# Patient Record
Sex: Male | Born: 2000 | Race: Black or African American | Hispanic: No | Marital: Single | Smoking: Never smoker
Health system: Southern US, Community
[De-identification: ages and names within clinical notes are randomized; demographics above are authoritative.]

---

## 2021-05-15 ENCOUNTER — Emergency Department (HOSPITAL_COMMUNITY): Payer: Medicaid Other

## 2021-05-15 ENCOUNTER — Other Ambulatory Visit: Payer: Self-pay

## 2021-05-15 ENCOUNTER — Encounter (HOSPITAL_COMMUNITY): Payer: Self-pay | Admitting: Emergency Medicine

## 2021-05-15 ENCOUNTER — Observation Stay (HOSPITAL_COMMUNITY)
Admission: EM | Admit: 2021-05-15 | Discharge: 2021-05-16 | Disposition: A | Discharge status: Home / Self Care | Payer: Medicaid Other | Attending: Emergency Medicine | Admitting: Emergency Medicine

## 2021-05-15 DIAGNOSIS — Y900 Blood alcohol level of less than 20 mg/100 ml: Secondary | ICD-10-CM | POA: Insufficient documentation

## 2021-05-15 DIAGNOSIS — Z20822 Contact with and (suspected) exposure to covid-19: Secondary | ICD-10-CM | POA: Insufficient documentation

## 2021-05-15 DIAGNOSIS — R45851 Suicidal ideations: Secondary | ICD-10-CM | POA: Insufficient documentation

## 2021-05-15 DIAGNOSIS — R110 Nausea: Secondary | ICD-10-CM | POA: Diagnosis present

## 2021-05-15 DIAGNOSIS — Z79899 Other long term (current) drug therapy: Secondary | ICD-10-CM | POA: Insufficient documentation

## 2021-05-15 DIAGNOSIS — D72829 Elevated white blood cell count, unspecified: Secondary | ICD-10-CM | POA: Diagnosis not present

## 2021-05-15 DIAGNOSIS — N179 Acute kidney failure, unspecified: Secondary | ICD-10-CM | POA: Diagnosis not present

## 2021-05-15 LAB — COMPREHENSIVE METABOLIC PANEL
ALT: 24 U/L (ref 0–44)
AST: 40 U/L (ref 15–41)
Albumin: 5.2 g/dL — ABNORMAL HIGH (ref 3.5–5.0)
Alkaline Phosphatase: 60 U/L (ref 38–126)
Anion gap: 15 (ref 5–15)
BUN: 12 mg/dL (ref 6–20)
CO2: 22 mmol/L (ref 22–32)
Calcium: 10 mg/dL (ref 8.9–10.3)
Chloride: 103 mmol/L (ref 98–111)
Creatinine, Ser: 1.93 mg/dL — ABNORMAL HIGH (ref 0.61–1.24)
GFR, Estimated: 51 mL/min — ABNORMAL LOW (ref >60)
Glucose, Bld: 130 mg/dL — ABNORMAL HIGH (ref 70–99)
Potassium: 3.7 mmol/L (ref 3.5–5.1)
Sodium: 140 mmol/L (ref 135–145)
Total Bilirubin: 1.4 mg/dL — ABNORMAL HIGH (ref 0.3–1.2)
Total Protein: 8.2 g/dL — ABNORMAL HIGH (ref 6.5–8.1)

## 2021-05-15 LAB — CBC WITH DIFFERENTIAL/PLATELET
Abs Immature Granulocytes: 0.12 10*3/uL — ABNORMAL HIGH (ref 0.00–0.07)
Basophils Absolute: 0 10*3/uL (ref 0.0–0.1)
Basophils Relative: 0 %
Eosinophils Absolute: 0 10*3/uL (ref 0.0–0.5)
Eosinophils Relative: 0 %
HCT: 47 % (ref 39.0–52.0)
Hemoglobin: 16.2 g/dL (ref 13.0–17.0)
Immature Granulocytes: 1 %
Lymphocytes Relative: 7 %
Lymphs Abs: 1.3 10*3/uL (ref 0.7–4.0)
MCH: 30.6 pg (ref 26.0–34.0)
MCHC: 34.5 g/dL (ref 30.0–36.0)
MCV: 88.7 fL (ref 80.0–100.0)
Monocytes Absolute: 1.7 10*3/uL — ABNORMAL HIGH (ref 0.1–1.0)
Monocytes Relative: 9 %
Neutro Abs: 15.4 10*3/uL — ABNORMAL HIGH (ref 1.7–7.7)
Neutrophils Relative %: 83 %
Platelets: 350 10*3/uL (ref 150–400)
RBC: 5.3 MIL/uL (ref 4.22–5.81)
RDW: 12.3 % (ref 11.5–15.5)
WBC: 18.5 10*3/uL — ABNORMAL HIGH (ref 4.0–10.5)
nRBC: 0 % (ref 0.0–0.2)

## 2021-05-15 LAB — RESP PANEL BY RT-PCR (FLU A&B, COVID) ARPGX2
Influenza A by PCR: NEGATIVE
Influenza B by PCR: NEGATIVE
SARS Coronavirus 2 by RT PCR: NEGATIVE

## 2021-05-15 LAB — T4, FREE: Free T4: 1.22 ng/dL — ABNORMAL HIGH (ref 0.61–1.12)

## 2021-05-15 LAB — ACETAMINOPHEN LEVEL: Acetaminophen (Tylenol), Serum: <10 ug/mL — ABNORMAL LOW (ref 10–30)

## 2021-05-15 LAB — ETHANOL: Alcohol, Ethyl (B): <10 mg/dL (ref <10)

## 2021-05-15 LAB — TSH: TSH: 1.05 u[IU]/mL (ref 0.350–4.500)

## 2021-05-15 LAB — SALICYLATE LEVEL: Salicylate Lvl: <7 mg/dL — ABNORMAL LOW (ref 7.0–30.0)

## 2021-05-15 MED ORDER — ACETAMINOPHEN 325 MG PO TABS
650.0000 mg | ORAL_TABLET | Freq: Once | ORAL | Status: AC
  Administered 2021-05-15: 650 mg via ORAL
  Filled 2021-05-15: qty 2

## 2021-05-15 MED ORDER — LORAZEPAM 1 MG PO TABS
1.0000 mg | ORAL_TABLET | Freq: Once | ORAL | Status: AC
  Administered 2021-05-15: 1 mg via ORAL
  Filled 2021-05-15: qty 1

## 2021-05-15 MED ORDER — SODIUM CHLORIDE 0.9 % IV BOLUS
1000.0000 mL | Freq: Once | INTRAVENOUS | Status: AC
  Administered 2021-05-15: 1000 mL via INTRAVENOUS

## 2021-05-15 NOTE — ED Provider Notes (Signed)
Emergency Medicine Provider Triage Evaluation Note  Alexander Gilmore , a 20 y.o. male  was evaluated in triage.  Pt complains of SI with plan to cut himself. He states the cops brought him here because they thought he had drugs in his system.  Review of Systems  Positive: Si Negative: Abd pain, nvd, urinary sxs  Physical Exam  BP (!) 159/114 (BP Location: Right Arm)   Pulse (!) 112   Temp 100.3 F (37.9 C)   Resp (!) 21   SpO2 95%  Gen:   Awake, no distress   Resp:  Normal effort  MSK:   Moves extremities without difficulty  Other:  Abd soft  Medical Decision Making  Medically screening exam initiated at 4:45 PM.  Appropriate orders placed.  ZEALAND BOYETT was informed that the remainder of the evaluation will be completed by another provider, this initial triage assessment does not replace that evaluation, and the importance of remaining in the ED until their evaluation is complete.     Karrie Meres, PA-C 05/15/21 1647    Rozelle Logan, DO 05/16/21 2309

## 2021-05-15 NOTE — ED Notes (Signed)
Pt's belongings in triage. Pt has jeans, tshirts, sandals and a wallet. Bag is marked with pt label.

## 2021-05-15 NOTE — H&P (Signed)
Initial vitals showed BP History and Physical    BEECHER FURIO RXV:[id number] DOB: 2001/06/05 DOA: 05/15/2021  PCP: Leslie Andrea, FNP  Patient coming from: Home  I have personally briefly reviewed patient's old medical records in [facility]  Chief Complaint: Suicidal ideation  HPI: Alexander Gilmore is a 20 y.o. male without known significant medical history who presented to the ED for evaluation of suicidal ideation.  Patient states that he came to the hospital due to having suicidal thoughts.  He said this has been ongoing for about 3 weeks.  He says he had a plan but did not act on it and will not elaborate further.  He says he is currently not having feelings of hurting himself.  He states that he has been having some cramping sensation in his legs with an episode of nausea.  He has not had any emesis.  He reports feeling of subjective fever.  He denies any chills or diaphoresis.  He denies any chest pain, dyspnea, cough, abdominal pain, dysuria, skin changes or wounds.  He says his current medications are Arman Filter (reports a history of bipolar disorder and borderline personality disorder) and ibuprofen (approximately 2 every other day).  He reports smoking about half a pack of cigarettes per day.  He denies any alcohol or illicit drug use.  ED Course:  Initial vitals showed BP 159/114, pulse 112, RR 21, temp 100.3 F, SPO2 95% on room air.  Labs show sodium 140, potassium 3.7, bicarb 22, BUN 12, creatinine 1.93 (previously 0.83 on 01/25/2021), serum glucose 130, AST 40, ALT 24, alk phos 60, total bilirubin 1.4, WBC 18.5 (previously 6.5 on 01/25/2021), hemoglobin 16.2, platelets 350,000.  Serum ethanol, salicylate, and acetaminophen levels undetectable.  TSH 1.050, free T4 1.22.  SARS-CoV-2 PCR panel is negative.  Urinalysis and UDS ordered and pending collection.  Portable chest x-ray is negative for focal consolidation, edema, or effusion.  Patient was given 1 L normal saline  bolus, 1 mg oral Ativan, and oral Tylenol.  Consulted TTS/psych was placed.  Given new AKI and leukocytosis hospitalist service was consulted to admit for further evaluation and management.  Review of Systems:  All systems reviewed and are negative except as documented in history of present illness above.   History reviewed. No pertinent past medical history.  History reviewed. No pertinent surgical history.  Social History:  has no history on file for tobacco use, alcohol use, and drug use.  No Known Allergies  No family history on file.   Prior to Admission medications   Medication Sig Start Date End Date Taking? Authorizing Provider  cetirizine (ZYRTEC) 10 MG tablet Take 10 mg by mouth daily as needed for allergies.   Yes [provider]  fluticasone (FLONASE) 50 MCG/ACT nasal spray Place 1 spray into both nostrils daily as needed for allergies. 04/08/21  Yes [provider]  ibuprofen (ADVIL) 600 MG tablet Take 600 mg by mouth every 6 (six) hours as needed for headache or moderate pain. 04/08/21  Yes [provider]  Multiple Vitamins-Minerals (MULTI-VITAMIN GUMMIES PO) Take 2 tablets by mouth daily.   Yes [provider]  ondansetron (ZOFRAN-ODT) 4 MG disintegrating tablet Take 4 mg by mouth 3 (three) times daily as needed for nausea. 01/26/21  Yes [provider]  VRAYLAR 1.5 MG capsule Take 1.5 mg by mouth daily. 02/17/21  Yes [provider]    Physical Exam: Vitals:   05/15/21 1637  BP: (!) 159/114  Pulse: Marland Kitchen)  112  Resp: (!) 21  Temp: 100.3 F (37.9 C)  SpO2: 95%   Constitutional: Resting in bed, NAD, calm, comfortable Eyes: PERRL, lids and conjunctivae normal ENMT: Mucous membranes are moist. Posterior pharynx clear of any exudate or lesions.Normal dentition.  Neck: normal, supple, no masses. Respiratory: clear to auscultation bilaterally, no wheezing, no crackles. Normal respiratory effort. No accessory muscle use.   Cardiovascular: Regular rate and rhythm, no murmurs / rubs / gallops. No extremity edema. 2+ pedal pulses. Abdomen: no tenderness, no masses palpated.  Musculoskeletal: no clubbing / cyanosis. No joint deformity upper and lower extremities. Good ROM, no contractures. Normal muscle tone.  Skin: no rashes, lesions, ulcers. No induration Neurologic: CN 2-12 grossly intact. Sensation intact. Strength 5/5 in all 4.  Psychiatric: Alert and oriented x 3.  Flat affect.  Labs on Admission: I have personally reviewed following labs and imaging studies  CBC: Recent Labs  Lab 05/15/21 1647  WBC 18.5*  NEUTROABS 15.4*  HGB 16.2  HCT 47.0  MCV 88.7  PLT 528   Basic Metabolic Panel: Recent Labs  Lab 05/15/21 1647  NA 140  K 3.7  CL 103  CO2 22  GLUCOSE 130*  BUN 12  CREATININE 1.93*  CALCIUM 10.0   GFR: CrCl cannot be calculated (Unknown ideal weight.). Liver Function Tests: Recent Labs  Lab 05/15/21 1647  AST 40  ALT 24  ALKPHOS 60  BILITOT 1.4*  PROT 8.2*  ALBUMIN 5.2*   No results for input(s): LIPASE, AMYLASE in the last 168 hours. No results for input(s): AMMONIA in the last 168 hours. Coagulation Profile: No results for input(s): INR, PROTIME in the last 168 hours. Cardiac Enzymes: No results for input(s): CKTOTAL, CKMB, CKMBINDEX, TROPONINI in the last 168 hours. BNP (last 3 results) No results for input(s): PROBNP in the last 8760 hours. HbA1C: No results for input(s): HGBA1C in the last 72 hours. CBG: No results for input(s): GLUCAP in the last 168 hours. Lipid Profile: No results for input(s): CHOL, HDL, LDLCALC, TRIG, CHOLHDL, LDLDIRECT in the last 72 hours. Thyroid Function Tests: Recent Labs    05/15/21 2035  TSH 1.050  FREET4 1.22*   Anemia Panel: No results for input(s): VITAMINB12, FOLATE, FERRITIN, TIBC, IRON, RETICCTPCT in the last 72 hours. Urine analysis: No results found for: COLORURINE, APPEARANCEUR, LABSPEC, [place], GLUCOSEU, HGBUR,  BILIRUBINUR, KETONESUR, PROTEINUR, UROBILINOGEN, NITRITE, LEUKOCYTESUR  Radiological Exams on Admission: DG Chest 1 View  Result Date: 05/15/2021 CLINICAL DATA:  Chest pain. EXAM: CHEST  1 VIEW COMPARISON:  None. FINDINGS: The heart size and mediastinal contours are within normal limits. Both lungs are clear. The visualized skeletal structures are unremarkable. IMPRESSION: No active disease. Electronically Signed   By: Virgina Norfolk M.D.   On: 05/15/2021 19:49    EKG: Not performed.  Assessment/Plan Principal Problem:   Acute kidney injury ([place]) Active Problems:   Leukocytosis   Suicidal ideation   JIANNI BATTEN is a 20 y.o. male without known significant medical history who is admitted with AKI after presenting with suicidal ideation.  Acute kidney injury: Creatinine 1.93 on admission compared to 0.83 on 01/25/2021.  No clear cause, possibly NSAID related. -Obtain renal ultrasound -Follow urinalysis -Continue IV fluid hydration overnight -Avoid NSAIDs and other nephrotoxic agents -Repeat labs in a.m.  Leukocytosis: WBC 18.5 on admission with neutrophilic predominance, mild elevation of monocytes.  No obvious infectious source.  CXR without evidence of pneumonia.  Urinalysis pending.  Obtain blood cultures.  Repeat labs in AM.  Suicidal ideation: Reports 3 weeks suicidal ideation prior to arrival.  Denies further SI at time of my evaluation.  Patient reports personal history of bipolar disorder and borderline personality disorder. -TTS/psychiatry consulted in ED -Place on suicide precautions  DVT prophylaxis: We will ask Code Status: Full code Family Communication: Discussed with patient Disposition Plan: Pending clinical progress Consults called: Psychiatry Level of care: Med-Surg Admission status:  Status is: Observation  The patient remains OBS appropriate and will d/c before 2 midnights.  Dispo: The patient is from: Home              Anticipated d/c is to: Home               Patient currently is not medically stable to d/c.    Zada Finders MD [facility]  If 7PM-7AM, please contact night-coverage [email or web address]  05/15/2021, 11:21 PM

## 2021-05-15 NOTE — ED Provider Notes (Signed)
[facility] EMERGENCY DEPARTMENT Provider Note   CSN: [id number] Arrival date & time: 05/15/21  1630     History Chief Complaint  Patient presents with   Suicidal    Alexander Gilmore is a 20 y.o. male.  20 yo M with a chief complaints of suicidal ideation.  The patient is unable to tell me any further information.  He tells me that nothing else is bothering him denies cough congestion fever denies nausea vomiting diarrhea denies abdominal pain chest pain headache or neck pain.  He denies urinary symptoms.  The history is provided by the patient.  Illness Severity:  Moderate Onset quality:  Gradual Duration:  2 days Timing:  Constant Progression:  Worsening Chronicity:  New Associated symptoms: no abdominal pain, no chest pain, no congestion, no diarrhea, no fever, no headaches, no myalgias, no rash, no shortness of breath and no vomiting       History reviewed. No pertinent past medical history.  There are no problems to display for this patient.   History reviewed. No pertinent surgical history.     No family history on file.     Home Medications Prior to Admission medications   Not on File    Allergies    Patient has no known allergies.  Review of Systems   Review of Systems  Constitutional:  Negative for chills and fever.  HENT:  Negative for congestion and facial swelling.   Eyes:  Negative for discharge and visual disturbance.  Respiratory:  Negative for shortness of breath.   Cardiovascular:  Negative for chest pain and palpitations.  Gastrointestinal:  Negative for abdominal pain, diarrhea and vomiting.  Musculoskeletal:  Negative for arthralgias and myalgias.  Skin:  Negative for color change and rash.  Neurological:  Negative for tremors, syncope and headaches.  Psychiatric/Behavioral:  Positive for suicidal ideas. Negative for confusion and dysphoric mood.    Physical Exam Updated Vital Signs BP (!) 159/114 (BP Location:  Right Arm)   Pulse (!) 112   Temp 100.3 F (37.9 C)   Resp (!) 21   SpO2 95%   Physical Exam Vitals and nursing note reviewed.  Constitutional:      Appearance: He is well-developed.  HENT:     Head: Normocephalic and atraumatic.  Eyes:     Pupils: Pupils are equal, round, and reactive to light.  Neck:     Vascular: No JVD.  Cardiovascular:     Rate and Rhythm: Normal rate and regular rhythm.     Heart sounds: No murmur heard.   No friction rub. No gallop.  Pulmonary:     Effort: No respiratory distress.     Breath sounds: No wheezing.  Abdominal:     General: There is no distension.     Tenderness: There is no abdominal tenderness. There is no guarding or rebound.  Musculoskeletal:        General: Normal range of motion.     Cervical back: Normal range of motion and neck supple.  Skin:    Coloration: Skin is not pale.     Findings: No rash.  Neurological:     Mental Status: He is alert and oriented to person, place, and time.  Psychiatric:        Attention and Perception: He perceives visual hallucinations.        Behavior: Behavior normal.        Thought Content: Thought content is paranoid.     Comments: Keeps looking over  his shoulder at things that arent there    ED Results / Procedures / Treatments   Labs (all labs ordered are listed, but only abnormal results are displayed) Labs Reviewed  COMPREHENSIVE METABOLIC PANEL - Abnormal; Notable for the following components:      Result Value   Glucose, Bld 130 (*)    Creatinine, Ser 1.93 (*)    Total Protein 8.2 (*)    Albumin 5.2 (*)    Total Bilirubin 1.4 (*)    GFR, Estimated 51 (*)    All other components within normal limits  CBC WITH DIFFERENTIAL/PLATELET - Abnormal; Notable for the following components:   WBC 18.5 (*)    Neutro Abs 15.4 (*)    Monocytes Absolute 1.7 (*)    Abs Immature Granulocytes 0.12 (*)    All other components within normal limits  SALICYLATE LEVEL - Abnormal; Notable for the  following components:   Salicylate Lvl <7.0 (*)    All other components within normal limits  ACETAMINOPHEN LEVEL - Abnormal; Notable for the following components:   Acetaminophen (Tylenol), Serum <10 (*)    All other components within normal limits  RESP PANEL BY RT-PCR (FLU A&B, COVID) ARPGX2  ETHANOL  RAPID URINE DRUG SCREEN, HOSP PERFORMED  URINALYSIS, ROUTINE W REFLEX MICROSCOPIC  TSH  T4, FREE    EKG None  Radiology No results found.  Procedures Procedures   Medications Ordered in ED Medications  acetaminophen (TYLENOL) tablet 650 mg (has no administration in time range)  sodium chloride 0.9 % bolus 1,000 mL (has no administration in time range)    ED Course  I have reviewed the triage vital signs and the nursing notes.  Pertinent labs & imaging results that were available during my care of the patient were reviewed by me and considered in my medical decision making (see chart for details).    MDM Rules/Calculators/A&P                           20 yo M with a chief complaints of suicidal ideation.  Patient found to have an AKI as well as leukocytosis.  He is also tachycardic and borderline febrile.  He is not currently endorsing any infectious symptoms.  No headaches no neck stiffness.  We will give a bolus of IV fluids check a thyroid level.  Reassess.  TSH is normal.  Patient continues to be significantly paranoid.  With AKI and leukocytosis and borderline fever will discuss with the hospitalist for possible admission.  The patients results and plan were reviewed and discussed.   Any x-rays performed were independently reviewed by myself.   Differential diagnosis were considered with the presenting HPI.  Medications  enoxaparin (LOVENOX) injection 40 mg (has no administration in time range)  0.9 %  sodium chloride infusion ( Intravenous Rate/Dose Change 05/16/21 1025)  acetaminophen (TYLENOL) tablet 650 mg (has no administration in time range)    Or   acetaminophen (TYLENOL) suppository 650 mg (has no administration in time range)  ondansetron (ZOFRAN) tablet 4 mg (has no administration in time range)    Or  ondansetron (ZOFRAN) injection 4 mg (has no administration in time range)  senna-docusate (Senokot-S) tablet 1 tablet (has no administration in time range)  cariprazine (VRAYLAR) capsule 1.5 mg (1.5 mg Oral Not Given 05/16/21 1050)  diphenhydrAMINE (BENADRYL) capsule 25 mg (25 mg Oral Given 05/16/21 1026)  acetaminophen (TYLENOL) tablet 650 mg (650 mg Oral Given 05/15/21 2002)  sodium chloride 0.9 % bolus 1,000 mL (0 mLs Intravenous Stopped 05/16/21 0049)  LORazepam (ATIVAN) tablet 1 mg (1 mg Oral Given 05/15/21 2117)  haloperidol lactate (HALDOL) injection 1 mg (1 mg Intravenous Given 05/16/21 0123)    Vitals:   05/16/21 1204 05/16/21 1419 05/16/21 1420 05/16/21 1805  BP: 128/76 117/73 117/73 (!) 148/99  Pulse: 74 78 78 (!) 108  Resp: 16 18 18 18   Temp: 98.6 F (37 C) 99.2 F (37.3 C) 99.2 F (37.3 C) 98.7 F (37.1 C)  TempSrc: Oral Oral Oral Oral  SpO2: 100% 100% 100% 100%    Final diagnoses:  Leukocytosis  Acute kidney injury (HCC)    Admission/ observation were discussed with the admitting physician, patient and/or family and they are comfortable with the plan.   Final Clinical Impression(s) / ED Diagnoses Final diagnoses:  Leukocytosis    Rx / DC Orders ED Discharge Orders     None        , DO 05/16/21 1900

## 2021-05-15 NOTE — ED Triage Notes (Signed)
Pt reports feeling suicidal x 2 weeks. Pt states he has a plan, did not disclose. Calm and cooperative at this time.

## 2021-05-15 NOTE — ED Notes (Signed)
PT given blankets and pillow as requested. Also given a drink

## 2021-05-16 ENCOUNTER — Observation Stay (HOSPITAL_COMMUNITY): Payer: Medicaid Other

## 2021-05-16 DIAGNOSIS — N179 Acute kidney failure, unspecified: Secondary | ICD-10-CM | POA: Diagnosis not present

## 2021-05-16 LAB — CBC
HCT: 43.7 % (ref 39.0–52.0)
Hemoglobin: 14.6 g/dL (ref 13.0–17.0)
MCH: 30.1 pg (ref 26.0–34.0)
MCHC: 33.4 g/dL (ref 30.0–36.0)
MCV: 90.1 fL (ref 80.0–100.0)
Platelets: 269 10*3/uL (ref 150–400)
RBC: 4.85 MIL/uL (ref 4.22–5.81)
RDW: 12.6 % (ref 11.5–15.5)
WBC: 9.6 10*3/uL (ref 4.0–10.5)
nRBC: 0 % (ref 0.0–0.2)

## 2021-05-16 LAB — RAPID URINE DRUG SCREEN, HOSP PERFORMED
Amphetamines: POSITIVE — AB
Barbiturates: NOT DETECTED
Benzodiazepines: NOT DETECTED
Cocaine: NOT DETECTED
Opiates: NOT DETECTED
Tetrahydrocannabinol: POSITIVE — AB

## 2021-05-16 LAB — BASIC METABOLIC PANEL
Anion gap: 9 (ref 5–15)
BUN: 7 mg/dL (ref 6–20)
CO2: 24 mmol/L (ref 22–32)
Calcium: 8.6 mg/dL — ABNORMAL LOW (ref 8.9–10.3)
Chloride: 101 mmol/L (ref 98–111)
Creatinine, Ser: 0.86 mg/dL (ref 0.61–1.24)
GFR, Estimated: >60 mL/min (ref >60)
Glucose, Bld: 97 mg/dL (ref 70–99)
Potassium: 3.3 mmol/L — ABNORMAL LOW (ref 3.5–5.1)
Sodium: 134 mmol/L — ABNORMAL LOW (ref 135–145)

## 2021-05-16 LAB — URINALYSIS, ROUTINE W REFLEX MICROSCOPIC
Bilirubin Urine: NEGATIVE
Glucose, UA: NEGATIVE mg/dL
Hgb urine dipstick: NEGATIVE
Ketones, ur: 5 mg/dL — AB
Leukocytes,Ua: NEGATIVE
Nitrite: NEGATIVE
Protein, ur: 30 mg/dL — AB
Specific Gravity, Urine: 1.03 (ref 1.005–1.030)
pH: 5 (ref 5.0–8.0)

## 2021-05-16 LAB — CK: Total CK: 18685 U/L — ABNORMAL HIGH (ref 49–397)

## 2021-05-16 LAB — SODIUM, URINE, RANDOM: Sodium, Ur: 50 mmol/L

## 2021-05-16 LAB — CREATININE, URINE, RANDOM: Creatinine, Urine: 498.97 mg/dL

## 2021-05-16 LAB — HIV ANTIBODY (ROUTINE TESTING W REFLEX): HIV Screen 4th Generation wRfx: NONREACTIVE

## 2021-05-16 MED ORDER — ENOXAPARIN SODIUM 40 MG/0.4ML IJ SOSY: 40.0000 mg | PREFILLED_SYRINGE | INTRAMUSCULAR | Status: DC

## 2021-05-16 MED ORDER — SENNOSIDES-DOCUSATE SODIUM 8.6-50 MG PO TABS: 1.0000 | ORAL_TABLET | Freq: Every evening | ORAL | Status: DC | PRN

## 2021-05-16 MED ORDER — ONDANSETRON HCL 4 MG/2ML IJ SOLN: 4.0000 mg | Freq: Four times a day (QID) | INTRAMUSCULAR | Status: DC | PRN

## 2021-05-16 MED ORDER — ACETAMINOPHEN 325 MG PO TABS: 650.0000 mg | ORAL_TABLET | Freq: Four times a day (QID) | ORAL | Status: DC | PRN

## 2021-05-16 MED ORDER — ONDANSETRON HCL 4 MG PO TABS: 4.0000 mg | ORAL_TABLET | Freq: Four times a day (QID) | ORAL | Status: DC | PRN

## 2021-05-16 MED ORDER — DIPHENHYDRAMINE HCL 25 MG PO CAPS
25.0000 mg | ORAL_CAPSULE | Freq: Four times a day (QID) | ORAL | Status: DC | PRN
  Administered 2021-05-16: 25 mg via ORAL
  Filled 2021-05-16: qty 1

## 2021-05-16 MED ORDER — ACETAMINOPHEN 650 MG RE SUPP: 650.0000 mg | Freq: Four times a day (QID) | RECTAL | Status: DC | PRN

## 2021-05-16 MED ORDER — HALOPERIDOL LACTATE 5 MG/ML IJ SOLN
1.0000 mg | Freq: Once | INTRAMUSCULAR | Status: AC | PRN
  Administered 2021-05-16: 1 mg via INTRAVENOUS
  Filled 2021-05-16: qty 1

## 2021-05-16 MED ORDER — CARIPRAZINE HCL 1.5 MG PO CAPS
1.5000 mg | ORAL_CAPSULE | Freq: Every day | ORAL | Status: DC
  Filled 2021-05-16: qty 1

## 2021-05-16 MED ORDER — SODIUM CHLORIDE 0.9 % IV SOLN: INTRAVENOUS | Status: DC

## 2021-05-16 NOTE — BH Assessment (Signed)
Comprehensive Clinical Assessment (CCA) Note  05/16/2021 Alexander Gilmore [id number]  DISPOSITION: Gave clinical report to Alexander Conn, FNP who recommends inpatient psychiatric treatment when medically cleared. Notified Dr. Darreld Gilmore and Alexander Bienenstock, RN of recommendation via secure message.  The patient demonstrates the following risk factors for suicide: Chronic risk factors for suicide include: psychiatric disorder of depression, substance use disorder, previous suicide attempts by cutting his wrist, and medical illness kidney disease . Acute risk factors for suicide include:  medical problems . Protective factors for this patient include: positive therapeutic relationship. Considering these factors, the overall suicide risk at this point appears to be high. Patient is not appropriate for outpatient follow up.  Flowsheet Row ED from 05/15/2021 in [facility] EMERGENCY DEPARTMENT  C-SSRS RISK CATEGORY High Risk      Pt is a 20 year old single male who presents unaccompanied to [name] ED reporting suicidal ideation with plan to cut his wrist. He also says he needs to "get a substance out of my system" but will not disclose what substance. Pt appears guarded, suspicious, and hypervigilant during assessment. Throughout the assessment he would suddenly sit up and look behind him. He denies current auditory or visual hallucinations. At times he would chose not to answer questions. Pt says he has been experiencing suicidal thoughts for the past three weeks. He states he has a history of mental health treatment and is currently prescribed Vraylar. Pt describes his mood as "sad" and acknowledges symptoms including crying spells, social withdrawal, loss of interest in usual pleasures, fatigue, irritability, decreased concentration, decreased sleep, decreased appetite and feelings of guilt, worthlessness and hopelessness. He says he has attempted suicide in the past by cutting his  wrist. He denies homicidal ideation or history of aggression.   Pt reports smoking marijuana daily. He says he drinks alcohol on occasion. He denies other substance use. Pt's urine drug screen and BAL have not resulted.  Pt initially denied any stressors. He says he works for [employer] and acknowledges the job is difficult. He acknowledges he lives with family members and when asked which family members he responds "just people." He identifies friends as his primary social support. He denies history of abuse or trauma. He denies legal problems. He denies access to firearms.   Pt says his outpatient mental health provider is [name], [facility]. He does not state who prescribes his psychiatric medications. Pt reports he was psychiatrically hospitalized at age 62 at a facility in [place].  Pt is dressed in hospital scrubs, alert and oriented x4. Pt speaks in a clear tone, at moderate volume and normal pace. Motor behavior appears restless and tense. Eye contact is infrequent. Pt's mood is depressed and anxious, affect is guarded and suspicious. Thought process is coherent and relevant. Pt's insight and judgment are currently impaired.  Chief Complaint:  Chief Complaint  Patient presents with   Suicidal   Visit Diagnosis: F29 Unspecified psychotic disorder   CCA Screening, Triage and Referral (STR)  Patient Reported Information How did you hear about [place]? Self  What Is the Reason for Your Visit/Call Today? Pt reports he has felt suicidal for three weeks. He also says he needs to "get a substance out of my system" but will not say what substance.  How Long Has This Been Causing You Problems? 1 wk - 1 month  What Do You Feel Would Help You the Most Today? Treatment for Depression or other mood problem; Medication(s)  Have You Recently Had Any Thoughts About Hurting Yourself? Yes  Are You Planning to Commit Suicide/Harm Yourself At This time? Yes   Have you Recently Had Thoughts About  Hurting Someone Alexander Gilmore? No  Are You Planning to Harm Someone at This Time? No  Explanation: No data recorded  Have You Used Any Alcohol or Drugs in the Past 24 Hours? Yes  How Long Ago Did You Use Drugs or Alcohol? No data recorded What Did You Use and How Much? Marijuana   Do You Currently Have a Therapist/Psychiatrist? Yes  Name of Therapist/Psychiatrist: [name]   Have You Been Recently Discharged From Any Office Practice or Programs? No  Explanation of Discharge From Practice/Program: No data recorded    CCA Screening Triage Referral Assessment Type of Contact: Tele-Assessment  Telemedicine Service Delivery: Telemedicine service delivery: This service was provided via telemedicine using a 2-way, interactive audio and video technology  Is this Initial or Reassessment? Initial Assessment  Date Telepsych consult ordered in [facility]:  05/15/21  Time Telepsych consult ordered in [facility]:  1647  Location of Assessment: [facility] ED  Provider Location: [facility] Assessment Services   Collateral Involvement: None   Does Patient Have a Automotive engineer Guardian? No data recorded Name and Contact of Legal Guardian: No data recorded If Minor and Not Living with Parent(s), Who has Custody? NA  Is CPS involved or ever been involved? Never  Is APS involved or ever been involved? Never   Patient Determined To Be At Risk for Harm To Self or Others Based on Review of Patient Reported Information or Presenting Complaint? Yes, for Self-Harm  Method: No data recorded Availability of Means: No data recorded Intent: No data recorded Notification Required: No data recorded Additional Information for Danger to Others Potential: No data recorded Additional Comments for Danger to Others Potential: No data recorded Are There Guns or Other Weapons in Your Home? No data recorded Types of Guns/Weapons: No data recorded Are These Weapons Safely Secured?                            No data  recorded Who Could Verify You Are Able To Have These Secured: No data recorded Do You Have any Outstanding Charges, Pending Court Dates, Parole/Probation? No data recorded Contacted To Inform of Risk of Harm To Self or Others: Unable to Contact:    Does Patient Present under Involuntary Commitment? No  IVC Papers Initial File Date: No data recorded  [place] of Residence: Other (Comment) Marga Melnick)   Patient Currently Receiving the Following Services: Individual Therapy   Determination of Need: Emergent (2 hours)   Options For Referral: Inpatient Hospitalization     CCA Biopsychosocial Patient Reported Schizophrenia/Schizoaffective Diagnosis in Past: No   Strengths: Pt is motivated for treatment   Mental Health Symptoms Depression:   Change in energy/activity; Difficulty Concentrating; Fatigue; Hopelessness; Increase/decrease in appetite; Irritability; Sleep (too much or little); Tearfulness; Worthlessness   Duration of Depressive symptoms:  Duration of Depressive Symptoms: Greater than two weeks   Mania:   Racing thoughts; Irritability; Change in energy/activity   Anxiety:    Difficulty concentrating; Fatigue; Irritability; Restlessness; Sleep; Tension; Worrying   Psychosis:   Delusions   Duration of Psychotic symptoms:  Duration of Psychotic Symptoms: Less than six months   Trauma:   None   Obsessions:   None   Compulsions:   None   Inattention:   N/A   Hyperactivity/Impulsivity:  N/A   Oppositional/Defiant Behaviors:   N/A   Emotional Irregularity:   None   Other Mood/Personality Symptoms:   NA    Mental Status Exam Appearance and self-care  Stature:   Average   Weight:   Overweight   Clothing:   -- (Scrubs)   Grooming:   Normal   Cosmetic use:   None   Posture/gait:   Tense   Motor activity:   Restless   Sensorium  Attention:   Distractible   Concentration:   Anxiety interferes   Orientation:   X5    Recall/memory:   Normal   Affect and Mood  Affect:   Anxious   Mood:   Anxious   Relating  Eye contact:   Fleeting   Facial expression:   Tense   Attitude toward examiner:   Uninterested; Suspicious   Thought and Language  Speech flow:  Normal   Thought content:   Suspicious   Preoccupation:   None   Hallucinations:   None   Organization:  No data recorded  [employer] of Knowledge:   Average   Intelligence:   Average   Abstraction:   Normal   Judgement:   Impaired   Reality Testing:   Distorted   Insight:   Lacking   Decision Making:   Impulsive   Social Functioning  Social Maturity:   Impulsive   Social Judgement:   Normal   Stress  Stressors:   Work   Coping Ability:   Human resources officer Deficits:   None   Supports:   Family     Religion: Religion/Spirituality Are You A Religious Person?: No  Leisure/Recreation: Leisure / Recreation Do You Have Hobbies?: Yes Leisure and Hobbies: Playing piano  Exercise/Diet: Exercise/Diet Do You Exercise?: No Have You Gained or Lost A Significant Amount of Weight in the Past Six Months?: No Do You Follow a Special Diet?: No Do You Have Any Trouble Sleeping?: Yes Explanation of Sleeping Difficulties: Pt reports decreased sleep. He cannot estimate how much sleep he has been getting.   CCA Employment/Education Employment/Work Situation: Employment / Work Situation Employment Situation: Employed Work Stressors: Pt reports working at [employer] and that the work is stressful. Patient's Job has Been Impacted by Current Illness: No Has Patient ever Been in the [employer]?: No  Education: Education Is Patient Currently Attending School?: No Last Grade Completed: 11 Did You Attend College?: No Did You Have An Individualized Education Program (IIEP): No Did You Have Any Difficulty At School?: No Patient's Education Has Been Impacted by Current Illness: No   CCA  Family/Childhood History Family and Relationship History: Family history Marital status: Single Does patient have children?: No  Childhood History:  Childhood History By whom was/is the patient raised?: Other (Comment) ("Nobody") Did patient suffer any verbal/emotional/physical/sexual abuse as a child?: No Did patient suffer from severe childhood neglect?: No Has patient ever been sexually abused/assaulted/raped as an adolescent or adult?: No Was the patient ever a victim of a crime or a disaster?: No Witnessed domestic violence?: No Has patient been affected by domestic violence as an adult?: No  Child/Adolescent Assessment:     CCA Substance Use Alcohol/Drug Use: Alcohol / Drug Use Pain Medications: Denies abuse Prescriptions: Denies abuse Over the Counter: Denies abuse History of alcohol / drug use?: Yes (Pt will not discuss details of substance use.) Longest period of sobriety (when/how long): Unknown  ASAM's:  Six Dimensions of Multidimensional Assessment  Dimension 1:  Acute Intoxication and/or Withdrawal Potential:      Dimension 2:  Biomedical Conditions and Complications:      Dimension 3:  Emotional, Behavioral, or Cognitive Conditions and Complications:     Dimension 4:  Readiness to Change:     Dimension 5:  Relapse, Continued use, or Continued Problem Potential:     Dimension 6:  Recovery/Living Environment:     ASAM Severity Score:    ASAM Recommended Level of Treatment:     Substance use Disorder (SUD)    Recommendations for Services/Supports/Treatments:    Discharge Disposition: Discharge Disposition Medical Exam completed: Yes Disposition of Patient: Admit  DSM5 Diagnoses: Patient Active Problem List   Diagnosis Date Noted   Acute kidney injury (HCC) 05/15/2021   Leukocytosis 05/15/2021   Suicidal ideation 05/15/2021     Referrals to Alternative Service(s): Referred to Alternative Service(s):   Place:    Date:   Time:    Referred to Alternative Service(s):   Place:   Date:   Time:    Referred to Alternative Service(s):   Place:   Date:   Time:    Referred to Alternative Service(s):   Place:   Date:   Time:     Pamalee Leyden, [facility]

## 2021-05-16 NOTE — ED Notes (Signed)
Pt states he is seeing a figure in the hallway. Asking this RN if that person is sleeping. Pt informed there is no person.  Pt is also having auditory hallucinations as he has been heard having a conversation alone. Pt is directable by staff but states he doesn't need to be here. Pt explained importance to stay for IVF for kidneys. Has refused blood cultures. Will reattempt collection with morning labs.

## 2021-05-16 NOTE — ED Notes (Signed)
Pt is now sleeping. Visible from nurses station. And connected to SpO2 for monitoring after receiving PRN haldol.

## 2021-05-16 NOTE — ED Notes (Signed)
Per NT Nikki Pt in hallway attempted to find exit. Concerns for elopement and SI. RN called staffing no sitter available.

## 2021-05-16 NOTE — ED Notes (Signed)
Pt removed his IV. Refused RN attempt as he is sleeping. RN will return to reattempt

## 2021-05-16 NOTE — ED Provider Notes (Signed)
Patient has been recommended for inpatient placement by TTS.  I was asked to fill out involuntary commitment papers.  I did fill out the involuntary commitment papers.   Koleen Distance, MD 05/16/21 [phone]

## 2021-05-16 NOTE — ED Notes (Signed)
Secure messages sent to Dr. Maryfrances Bunnell informing him that pt has been recommended in patient psych treatment for SI. He is voluntary at this time and throughout the night he attempted an escape but was redirectable. Given haldol.  This morning he removed his IV and has since refused replacement. Overnight he received a total of 500 mL of fluid. Now that he is more awake However unable to obtain IV access.   Plan to IVC RN to call TTS

## 2021-05-16 NOTE — Progress Notes (Signed)
Roselyn Reef to be D/C'd  per MD order.  Discussed with the patient and all questions fully answered.  VSS, Skin clean, dry and intact without evidence of skin break down, no evidence of skin tears noted.  IV catheter discontinued intact. Site without signs and symptoms of complications. Dressing and pressure applied.  An After Visit Summary was printed and given to the patient.  D/c education completed with patient/family including follow up instructions, medication list, d/c activities limitations if indicated, with other d/c instructions as indicated by MD - patient able to verbalize understanding, all questions fully answered.   Patient instructed to return to ED, call 911, or call MD for any changes in condition.   Patient walked out with RN; Rolan Bucco Cab took voucher to drive pt to [facility] in [place].

## 2021-05-16 NOTE — Consult Note (Signed)
[facility] Face-to-Face Psychiatry Consult   Reason for Consult:  suicidal ideations Referring Physician:  Dr. Maryfrances Bunnell Patient Identification: Alexander Gilmore MRN:  [id number] Principal Diagnosis: Acute kidney injury [facility]) Diagnosis:  Principal Problem:   Acute kidney injury (HCC) Active Problems:   Leukocytosis   Suicidal ideation   Total Time spent with patient: 45 minutes  Subjective:   Alexander Gilmore is a 20 y.o. male patient admitted with Suicidal ideations x2 weeks found to acute kidney injury and leukocytosis.  On evaluation today, patient tells me" he feels much better.  I was just going through something yesterday."  He further reports he was under the influence of unknown substances. "  My friends were doing something, and I took it.  I think they may have Diet Coke with something."  When assessing for current suicidal ideations, patient denies.  He further endorses his ability to contract for safety, and is fully aware of outpatient resources and emergency room services in the event his suicidal thoughts do return..  In terms of previous psychiatric history patient is currently diagnosed bipolar, and borderline personality disorder.  He is currently prescribed Vraylar, and is receiving outpatient psychiatric resources to include a psychiatrist for medication management.  He denies any history of suicide attempts, and or self-harm behaviors.  He denies any substance abuse, and or legal charges.  No urine drug screen is available at this time to confirm patient's statement.  He reports his mother heard her diagnosis of schizophrenia, currently stabilized on psychotropic medication.  He further states no one in his family has attempted or completed suicide.  At the present time patient is alert and oriented, calm and cooperative, very pleasant on initial interaction.  He is able to have a linear conversation, and answers all questions appropriately.  He currently denies suicidal ideations,  homicidal ideations, and or auditory or visual hallucinations.  He is able to contract for safety, and provides protective factors that would keep him safe in the community.  Patient will be psychiatrically cleared at this time with appropriate outpatient resources for therapy.  HPI:  Alexander Gilmore is a 20 y.o. male without known significant medical history who presented to the ED for evaluation of suicidal ideation.Patient states that he came to the hospital due to having suicidal thoughts.  He said this has been ongoing for about 3 weeks.  He says he had a plan but did not act on it and will not elaborate further.  He says he is currently not having feelings of hurting himself.He states that he has been having some cramping sensation in his legs with an episode of nausea.  He has not had any emesis.  He reports feeling of subjective fever.  He denies any chills or diaphoresis.  He denies any chest pain, dyspnea, cough, abdominal pain, dysuria, skin changes or wounds. He says his current medications are Leafy Kindle (reports a history of bipolar disorder and borderline personality disorder) and ibuprofen (approximately 2 every other day).  He reports smoking about half a pack of cigarettes per day.  He denies any alcohol or illicit drug use.  Past Psychiatric History: See above  Risk to Self: denies Risk to Others: denies Prior Inpatient Therapy: Denies  Prior Outpatient Therapy:  Denies  Past Medical History: History reviewed. No pertinent past medical history. History reviewed. No pertinent surgical history. Family History: No family history on file. Family Psychiatric  History: Mother-schizophrenia Social History:  Social History   Substance and Sexual Activity  Alcohol Use None     Social History   Substance and Sexual Activity  Drug Use Not on file    Social History   Socioeconomic History   Marital status: Single    Spouse name: Not on file   Number of children: Not on file   Years  of education: Not on file   Highest education level: Not on file  Occupational History   Not on file  Tobacco Use   Smoking status: Not on file   Smokeless tobacco: Not on file  Substance and Sexual Activity   Alcohol use: Not on file   Drug use: Not on file   Sexual activity: Not on file  Other Topics Concern   Not on file  Social History Narrative   Not on file   Social Determinants of Health   Financial Resource Strain: Not on file  Food Insecurity: Not on file  Transportation Needs: Not on file  Physical Activity: Not on file  Stress: Not on file  Social Connections: Not on file   Additional Social History:    Allergies:  No Known Allergies  Labs:  Results for orders placed or performed during the hospital encounter of 05/15/21 (from the past 48 hour(s))  Comprehensive metabolic panel     Status: Abnormal   Collection Time: 05/15/21  4:47 PM  Result Value Ref Range   Sodium 140 135 - 145 mmol/L   Potassium 3.7 3.5 - 5.1 mmol/L   Chloride 103 98 - 111 mmol/L   CO2 22 22 - 32 mmol/L   Glucose, Bld 130 (H) 70 - 99 mg/dL    Comment: Glucose reference range applies only to samples taken after fasting for at least 8 hours.   BUN 12 6 - 20 mg/dL   Creatinine, Ser 4.54 (H) 0.61 - 1.24 mg/dL   Calcium 09.8 8.9 - 11.9 mg/dL   Total Protein 8.2 (H) 6.5 - 8.1 g/dL   Albumin 5.2 (H) 3.5 - 5.0 g/dL   AST 40 15 - 41 U/L   ALT 24 0 - 44 U/L   Alkaline Phosphatase 60 38 - 126 U/L   Total Bilirubin 1.4 (H) 0.3 - 1.2 mg/dL   GFR, Estimated 51 (L) >60 mL/min    Comment: (NOTE) Calculated using the CKD-EPI Creatinine Equation (2021)    Anion gap 15 5 - 15    Comment: Performed at [facility], [address], [place], [place] [address]  CBC with Differential     Status: Abnormal   Collection Time: 05/15/21  4:47 PM  Result Value Ref Range   WBC 18.5 (H) 4.0 - 10.5 K/uL   RBC 5.30 4.22 - 5.81 MIL/uL   Hemoglobin 16.2 13.0 - 17.0 g/dL   HCT 95.6 21.3 - 08.6 %   MCV  88.7 80.0 - 100.0 fL   MCH 30.6 26.0 - 34.0 pg   MCHC 34.5 30.0 - 36.0 g/dL   RDW 57.8 46.9 - 62.9 %   Platelets 350 150 - 400 K/uL   nRBC 0.0 0.0 - 0.2 %   Neutrophils Relative % 83 %   Neutro Abs 15.4 (H) 1.7 - 7.7 K/uL   Lymphocytes Relative 7 %   Lymphs Abs 1.3 0.7 - 4.0 K/uL   Monocytes Relative 9 %   Monocytes Absolute 1.7 (H) 0.1 - 1.0 K/uL   Eosinophils Relative 0 %   Eosinophils Absolute 0.0 0.0 - 0.5 K/uL   Basophils Relative 0 %   Basophils  Absolute 0.0 0.0 - 0.1 K/uL   Immature Granulocytes 1 %   Abs Immature Granulocytes 0.12 (H) 0.00 - 0.07 K/uL    Comment: Performed at [facility], [address], [place], [place] [address]  Ethanol     Status: None   Collection Time: 05/15/21  4:47 PM  Result Value Ref Range   Alcohol, Ethyl (B) <10 <10 mg/dL    Comment: (NOTE) Lowest detectable limit for serum alcohol is 10 mg/dL.  For medical purposes only. Performed at [facility], [address], [place], [place] [address]   Salicylate level     Status: Abnormal   Collection Time: 05/15/21  4:47 PM  Result Value Ref Range   Salicylate Lvl <7.0 (L) 7.0 - 30.0 mg/dL    Comment: Performed at [facility], [address] [place], [place] [address]  Acetaminophen level     Status: Abnormal   Collection Time: 05/15/21  4:47 PM  Result Value Ref Range   Acetaminophen (Tylenol), Serum <10 (L) 10 - 30 ug/mL    Comment: (NOTE) Therapeutic concentrations vary significantly. A range of 10-30 ug/mL  may be an effective concentration for many patients. However, some  are best treated at concentrations outside of this range. Acetaminophen concentrations >150 ug/mL at 4 hours after ingestion  and >50 ug/mL at 12 hours after ingestion are often associated with  toxic reactions.  Performed at [facility], [address], [place], [place] [address]   TSH     Status: None   Collection Time: 05/15/21  8:35 PM  Result Value Ref Range   TSH 1.050 0.350 -  4.500 uIU/mL    Comment: Performed by a 3rd Generation assay with a functional sensitivity of <=0.01 uIU/mL. Performed at [facility], [address] [place], [place] [address]   T4, free     Status: Abnormal   Collection Time: 05/15/21  8:35 PM  Result Value Ref Range   Free T4 1.22 (H) 0.61 - 1.12 ng/dL    Comment: (NOTE) Biotin ingestion may interfere with free T4 tests. If the results are inconsistent with the TSH level, previous test results, or the clinical presentation, then consider biotin interference. If needed, order repeat testing after stopping biotin. Performed at [facility], [address], [place], [place] [address]   Resp Panel by RT-PCR (Flu A&B, Covid) Nasopharyngeal Swab     Status: None   Collection Time: 05/15/21 10:00 PM   Specimen: Nasopharyngeal Swab; Nasopharyngeal(NP) swabs in vial transport medium  Result Value Ref Range   SARS Coronavirus 2 by RT PCR NEGATIVE NEGATIVE    Comment: (NOTE) SARS-CoV-2 target nucleic acids are NOT DETECTED.  The SARS-CoV-2 RNA is generally detectable in upper respiratory specimens during the acute phase of infection. The lowest concentration of SARS-CoV-2 viral copies this assay can detect is 138 copies/mL. A negative result does not preclude SARS-Cov-2 infection and should not be used as the sole basis for treatment or other patient management decisions. A negative result may occur with  improper specimen collection/handling, submission of specimen other than nasopharyngeal swab, presence of viral mutation(s) within the areas targeted by this assay, and inadequate number of viral copies(<138 copies/mL). A negative result must be combined with clinical observations, patient history, and epidemiological information. The expected result is Negative.  Fact Sheet for Patients:  BloggerCourse.com  Fact Sheet for Healthcare Providers:  SeriousBroker.it  This  test is no t yet  approved or cleared by the [place] and  has been authorized for detection and/or diagnosis of SARS-CoV-2 by FDA under an Emergency Use Authorization (EUA). This EUA will remain  in effect (meaning this test can be used) for the duration of the COVID-19 declaration under Section 564(b)(1) of the Act, 21 U.S.C.section 360bbb-3(b)(1), unless the authorization is terminated  or revoked sooner.       Influenza A by PCR NEGATIVE NEGATIVE   Influenza B by PCR NEGATIVE NEGATIVE    Comment: (NOTE) The Xpert Xpress SARS-CoV-2/FLU/RSV plus assay is intended as an aid in the diagnosis of influenza from Nasopharyngeal swab specimens and should not be used as a sole basis for treatment. Nasal washings and aspirates are unacceptable for Xpert Xpress SARS-CoV-2/FLU/RSV testing.  Fact Sheet for Patients: BloggerCourse.com  Fact Sheet for Healthcare Providers: SeriousBroker.it  This test is not yet approved or cleared by the [place] FDA and has been authorized for detection and/or diagnosis of SARS-CoV-2 by FDA under an Emergency Use Authorization (EUA). This EUA will remain in effect (meaning this test can be used) for the duration of the COVID-19 declaration under Section 564(b)(1) of the Act, 21 U.S.C. section 360bbb-3(b)(1), unless the authorization is terminated or revoked.  Performed at [facility], [address], [place], [place] [address]   Basic metabolic panel     Status: Abnormal   Collection Time: 05/16/21  8:15 AM  Result Value Ref Range   Sodium 134 (L) 135 - 145 mmol/L   Potassium 3.3 (L) 3.5 - 5.1 mmol/L   Chloride 101 98 - 111 mmol/L   CO2 24 22 - 32 mmol/L   Glucose, Bld 97 70 - 99 mg/dL    Comment: Glucose reference range applies only to samples taken after fasting for at least 8 hours.   BUN 7 6 - 20 mg/dL   Creatinine, Ser 7.89 0.61 - 1.24 mg/dL    Comment: DELTA CHECK NOTED    Calcium 8.6 (L) 8.9 - 10.3 mg/dL   GFR, Estimated >38 >10 mL/min    Comment: (NOTE) Calculated using the CKD-EPI Creatinine Equation (2021)    Anion gap 9 5 - 15    Comment: Performed at [facility], [address], [place], [place] [address]  CBC     Status: None   Collection Time: 05/16/21  8:15 AM  Result Value Ref Range   WBC 9.6 4.0 - 10.5 K/uL   RBC 4.85 4.22 - 5.81 MIL/uL   Hemoglobin 14.6 13.0 - 17.0 g/dL   HCT 25.8 52.7 - 78.2 %   MCV 90.1 80.0 - 100.0 fL   MCH 30.1 26.0 - 34.0 pg   MCHC 33.4 30.0 - 36.0 g/dL   RDW 42.3 53.6 - 14.4 %   Platelets 269 150 - 400 K/uL   nRBC 0.0 0.0 - 0.2 %    Comment: Performed at [facility], [address] [place], [place] [address]    Current Facility-Administered Medications  Medication Dose Route Frequency Provider Last Rate Last Admin   0.9 %  sodium chloride infusion   Intravenous Continuous Alberteen Sam, MD 100 mL/hr at 05/16/21 1025 Rate Change at 05/16/21 1025   acetaminophen (TYLENOL) tablet 650 mg  650 mg Oral Q6H PRN Charlsie Quest, MD       Or   acetaminophen (TYLENOL) suppository 650 mg  650 mg Rectal Q6H PRN Charlsie Quest, MD       cariprazine (  VRAYLAR) capsule 1.5 mg  1.5 mg Oral Daily [name] R, MD       diphenhydrAMINE (BENADRYL) capsule 25 mg  25 mg Oral Q6H PRN Alberteen Sam, MD   25 mg at 05/16/21 1026   enoxaparin (LOVENOX) injection 40 mg  40 mg Subcutaneous Q24H [name] R, MD       ondansetron (ZOFRAN) tablet 4 mg  4 mg Oral Q6H PRN Charlsie Quest, MD       Or   ondansetron (ZOFRAN) injection 4 mg  4 mg Intravenous Q6H PRN Charlsie Quest, MD       senna-docusate (Senokot-S) tablet 1 tablet  1 tablet Oral QHS PRN Charlsie Quest, MD       Current Outpatient Medications  Medication Sig Dispense Refill   cetirizine (ZYRTEC) 10 MG tablet Take 10 mg by mouth daily as needed for allergies.     fluticasone (FLONASE) 50 MCG/ACT nasal spray Place 1 spray into both  nostrils daily as needed for allergies.     ibuprofen (ADVIL) 600 MG tablet Take 600 mg by mouth every 6 (six) hours as needed for headache or moderate pain.     Multiple Vitamins-Minerals (MULTI-VITAMIN GUMMIES PO) Take 2 tablets by mouth daily.     ondansetron (ZOFRAN-ODT) 4 MG disintegrating tablet Take 4 mg by mouth 3 (three) times daily as needed for nausea.     VRAYLAR 1.5 MG capsule Take 1.5 mg by mouth daily.      Musculoskeletal: Strength & Muscle Tone: within normal limits Gait & Station: normal Patient leans: N/A   Psychiatric Specialty Exam:  Presentation  General Appearance: Appropriate for Environment; Casual  Eye Contact:Fair  Speech:Clear and Coherent; Normal Rate  Speech Volume:Normal  Handedness:Right   Mood and Affect  Mood:Anxious  Affect:Appropriate   Thought Process  Thought Processes:Coherent; Linear  Descriptions of Associations:Intact  Orientation:Full (Time, Place and Person)  Thought Content:Logical  History of Schizophrenia/Schizoaffective disorder:No  Duration of Psychotic Symptoms:N/A  Hallucinations:Hallucinations: None  Ideas of Reference:None  Suicidal Thoughts:Suicidal Thoughts: No  Homicidal Thoughts:Homicidal Thoughts: No   Sensorium  Memory:Immediate Fair; Recent Fair; Remote Fair  Judgment:Fair  Insight:Fair   Executive Functions  Concentration:Fair  Attention Span:Fair  Recall:Fair  Fund of Knowledge:Fair  Language:Fair   Psychomotor Activity  Psychomotor Activity:Psychomotor Activity: Normal   Assets  Assets:Financial Resources/Insurance; Desire for Improvement; Communication Skills; Physical Health; Resilience; Social Support; Talents/Skills   Sleep  Sleep:Sleep: Fair   Physical Exam: Physical Exam Vitals and nursing note reviewed.  Constitutional:      Appearance: He is normal weight.     Comments: dishevel  Neurological:     Mental Status: He is alert.  Psychiatric:         Behavior: Behavior is cooperative.   Review of Systems  Psychiatric/Behavioral:  Positive for substance abuse. Negative for depression, hallucinations, memory loss and suicidal ideas. The patient is not nervous/anxious and does not have insomnia.   All other systems reviewed and are negative. Blood pressure 140/77, pulse (!) 102, temperature 97.8 F (36.6 C), resp. rate 18, SpO2 100 %. There is no height or weight on file to calculate BMI.  Treatment Plan Summary: Plan Patient will be psych cleared at this time. He is able to contract for safety and is aware of psychiatric resources. He is able to identify emergency services in addition to mobile crisis if he needs it. He also has insight at this time to his ongoing medical condition that requires admission.  Disposition: No evidence of imminent risk to self or others at present.   Patient does not meet criteria for psychiatric inpatient admission. Supportive therapy provided about ongoing stressors. Refer to IOP. Discussed crisis plan, support from social network, calling 911, coming to the Emergency Department, and calling Suicide Hotline.  Maryagnes Amos, FNP 05/16/2021 10:42 AM

## 2021-05-16 NOTE — Discharge Summary (Addendum)
Physician Discharge Summary  RAYMIR FROMMELT YWV:[id number] DOB: October 22, 2000 DOA: 05/15/2021  PCP: Frederich Balding, FNP  Admit date: 05/15/2021 Discharge date: 05/16/2021  Admitted From: Home  Disposition:  Home   Recommendations for Outpatient Follow-up:  Follow up with Mabeline Caras PCP in 1 week Please obtain BMP in one week Follow up with Psychiatry in 1 week      Home Health: None   Equipment/Devices: None new  Discharge Condition: Good  CODE STATUS: FULL Diet recommendation: Regular  Brief/Interim Summary: Mr. Bernasconi is a 20 y.o. M with hx Bipolar and borderline personality disorder who presented with suicidal ideation.  He reported 3 weeks of suicidal thoughts, with the intent to cut his wrists, which he has reportedly attempted in the past.  He initially appeared to be under the influence of an unknown substance, and appeared hypervigilant.   TTS were consulted who recommended inpatient psychiatric treatment.  He was incidentally noted to have Cr 1.9 and leukocytosis.        PRINCIPAL HOSPITAL DIAGNOSIS: Acute kidney injury    Discharge Diagnoses:   AKI Patient admitted and given fluids.  UA without RBCs or WBCs.  Given fluids and Cr returned to baseline in the morning.CK noted but given resolution of AKI, clinical improvement, no symptoms, good UOP, feel this can be treated with oral hydration.     Leukocytosis CXR clear, UA without WBC, RBC.  Renal US without hydronephrosis.  No fever, and resolved overnight with fluids.  Likely this was demargination from whatever drug he was on.  WBC normalized with fluids.   Substance use Amphetamine positive UDS, consistent with hypervigilance and hallucinations on admission. Patient reports that this was unintentional drug use, he does not use.  Suicidal ideation Psychiatry re-evaluated the patient this morning and report he does not meet criteria for inpatient psychiatry any longer, should follow up with outpatient  Psychiatry, can contract for safety, is no longer a risk to self or others.  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 - Continue Vraylar          Discharge Instructions  Discharge Instructions     Discharge instructions   Complete by: As directed    From Dr. Maryfrances Bunnell: You were admitted for kidney failure. Likely, whatever substance you accidentally took yesterday caused you to be dehydrated and to have kidney failure.  Thankfully, this was brief.  When we rechecked your kidney function today, it was back to normal.  Drink plenty of fluids this week  Continue your home Vraylar  Go see your psychiatrist in 1 week.   Increase activity slowly   Complete by: As directed       Allergies as of 05/16/2021   No Known Allergies      Medication List     TAKE these medications    cetirizine 10 MG tablet Commonly known as: ZYRTEC Take 10 mg by mouth daily as needed for allergies.   fluticasone 50 MCG/ACT nasal spray Commonly known as: FLONASE Place 1 spray into both nostrils daily as needed for allergies.   ibuprofen 600 MG tablet Commonly known as: ADVIL Take 600 mg by mouth every 6 (six) hours as needed for headache or moderate pain.   MULTI-VITAMIN GUMMIES PO Take 2 tablets by mouth daily.   ondansetron 4 MG disintegrating tablet Commonly known as: ZOFRAN-ODT Take 4 mg by mouth 3 (three) times daily as needed for nausea.   Vraylar 1.5 MG capsule Generic drug: cariprazine Take 1.5 mg by  mouth daily.        No Known Allergies  Consultations: Psychiatry   Procedures/Studies: DG Chest 1 View  Result Date: 05/15/2021 CLINICAL DATA:  Chest pain. EXAM: CHEST  1 VIEW COMPARISON:  None. FINDINGS: The heart size and mediastinal contours are within normal limits. Both lungs are clear. The visualized skeletal structures are unremarkable. IMPRESSION: No active disease. Electronically Signed   By: Aram Candela M.D.   On: 05/15/2021 19:49   US  RENAL  Result Date: 05/16/2021 CLINICAL DATA:  Acute renal injury. EXAM: RENAL / URINARY TRACT ULTRASOUND COMPLETE COMPARISON:  None. FINDINGS: Right Kidney: Renal measurements: 9.8 cm x 4.6 cm x 4.6 cm = volume: 108.84 mL. Echogenicity within normal limits. No mass or hydronephrosis visualized. Left Kidney: Renal measurements: 10.0 cm x 5.6 cm x 4.5 cm = volume: 132.57 mL. Echogenicity within normal limits. No mass or hydronephrosis visualized. Bladder: The urinary bladder is empty and subsequently limited in evaluation. Other: None. IMPRESSION: Normal renal ultrasound. Electronically Signed   By: Aram Candela M.D.   On: 05/16/2021 02:21      Subjective: Feeling well.  No fever, headache.  No urinary symptoms, no cough, sputum.  No abdominal pain.  Discharge Exam: Vitals:   05/16/21 1419 05/16/21 1420  BP: 117/73 117/73  Pulse: 78 78  Resp: 18 18  Temp: 99.2 F (37.3 C) 99.2 F (37.3 C)  SpO2: 100% 100%   Vitals:   05/16/21 0817 05/16/21 1204 05/16/21 1419 05/16/21 1420  BP: 140/77 128/76 117/73 117/73  Pulse: (!) 102 74 78 78  Resp: 18 16 18 18   Temp: 97.8 F (36.6 C) 98.6 F (37 C) 99.2 F (37.3 C) 99.2 F (37.3 C)  TempSrc:  Oral Oral Oral  SpO2: 100% 100% 100% 100%    General: Pt is alert, awake, not in acute distress Cardiovascular: RRR, nl S1-S2, no murmurs appreciated.   No LE edema.   Respiratory: Normal respiratory rate and rhythm.  CTAB without rales or wheezes. Abdominal: Abdomen soft and non-tender.  No distension or HSM.   Neuro/Psych: Strength symmetric in upper and lower extremities.  Judgment and insight appear normal.  Affect odd.   The results of significant diagnostics from this hospitalization (including imaging, microbiology, ancillary and laboratory) are listed below for reference.     Microbiology: Recent Results (from the past 240 hour(s))  Resp Panel by RT-PCR (Flu A&B, Covid) Nasopharyngeal Swab     Status: None   Collection Time:  05/15/21 10:00 PM   Specimen: Nasopharyngeal Swab; Nasopharyngeal(NP) swabs in vial transport medium  Result Value Ref Range Status   SARS Coronavirus 2 by RT PCR NEGATIVE NEGATIVE Final    Comment: (NOTE) SARS-CoV-2 target nucleic acids are NOT DETECTED.  The SARS-CoV-2 RNA is generally detectable in upper respiratory specimens during the acute phase of infection. The lowest concentration of SARS-CoV-2 viral copies this assay can detect is 138 copies/mL. A negative result does not preclude SARS-Cov-2 infection and should not be used as the sole basis for treatment or other patient management decisions. A negative result may occur with  improper specimen collection/handling, submission of specimen other than nasopharyngeal swab, presence of viral mutation(s) within the areas targeted by this assay, and inadequate number of viral copies(<138 copies/mL). A negative result must be combined with clinical observations, patient history, and epidemiological information. The expected result is Negative.  Fact Sheet for Patients:  05/17/21  Fact Sheet for Healthcare Providers:  BloggerCourse.com  This test is no t  yet approved or cleared by the [place] and  has been authorized for detection and/or diagnosis of SARS-CoV-2 by FDA under an Emergency Use Authorization (EUA). This EUA will remain  in effect (meaning this test can be used) for the duration of the COVID-19 declaration under Section 564(b)(1) of the Act, 21 U.S.C.section 360bbb-3(b)(1), unless the authorization is terminated  or revoked sooner.       Influenza A by PCR NEGATIVE NEGATIVE Final   Influenza B by PCR NEGATIVE NEGATIVE Final    Comment: (NOTE) The Xpert Xpress SARS-CoV-2/FLU/RSV plus assay is intended as an aid in the diagnosis of influenza from Nasopharyngeal swab specimens and should not be used as a sole basis for treatment. Nasal washings  and aspirates are unacceptable for Xpert Xpress SARS-CoV-2/FLU/RSV testing.  Fact Sheet for Patients: BloggerCourse.com  Fact Sheet for Healthcare Providers: SeriousBroker.it  This test is not yet approved or cleared by the [place] FDA and has been authorized for detection and/or diagnosis of SARS-CoV-2 by FDA under an Emergency Use Authorization (EUA). This EUA will remain in effect (meaning this test can be used) for the duration of the COVID-19 declaration under Section 564(b)(1) of the Act, 21 U.S.C. section 360bbb-3(b)(1), unless the authorization is terminated or revoked.  Performed at [facility], [address] [place], [place] [address]      Labs: BNP (last 3 results) No results for input(s): BNP in the last 8760 hours. Basic Metabolic Panel: Recent Labs  Lab 05/15/21 1647 05/16/21 0815  NA 140 134*  K 3.7 3.3*  CL 103 101  CO2 22 24  GLUCOSE 130* 97  BUN 12 7  CREATININE 1.93* 0.86  CALCIUM 10.0 8.6*   Liver Function Tests: Recent Labs  Lab 05/15/21 1647  AST 40  ALT 24  ALKPHOS 60  BILITOT 1.4*  PROT 8.2*  ALBUMIN 5.2*   No results for input(s): LIPASE, AMYLASE in the last 168 hours. No results for input(s): AMMONIA in the last 168 hours. CBC: Recent Labs  Lab 05/15/21 1647 05/16/21 0815  WBC 18.5* 9.6  NEUTROABS 15.4*  --   HGB 16.2 14.6  HCT 47.0 43.7  MCV 88.7 90.1  PLT 350 269   Cardiac Enzymes: No results for input(s): CKTOTAL, CKMB, CKMBINDEX, TROPONINI in the last 168 hours. BNP: Invalid input(s): POCBNP CBG: No results for input(s): GLUCAP in the last 168 hours. D-Dimer No results for input(s): DDIMER in the last 72 hours. Hgb A1c No results for input(s): HGBA1C in the last 72 hours. Lipid Profile No results for input(s): CHOL, HDL, LDLCALC, TRIG, CHOLHDL, LDLDIRECT in the last 72 hours. Thyroid function studies Recent Labs    05/15/21 2035  TSH 1.050    Anemia work up No results for input(s): VITAMINB12, FOLATE, FERRITIN, TIBC, IRON, RETICCTPCT in the last 72 hours. Urinalysis    Component Value Date/Time   COLORURINE AMBER (A) 05/16/2021 1050   APPEARANCEUR CLEAR 05/16/2021 1050   LABSPEC 1.030 05/16/2021 1050   PHURINE 5.0 05/16/2021 1050   GLUCOSEU NEGATIVE 05/16/2021 1050   HGBUR NEGATIVE 05/16/2021 1050   BILIRUBINUR NEGATIVE 05/16/2021 1050   KETONESUR 5 (A) 05/16/2021 1050   PROTEINUR 30 (A) 05/16/2021 1050   NITRITE NEGATIVE 05/16/2021 1050   LEUKOCYTESUR NEGATIVE 05/16/2021 1050   Sepsis Labs Invalid input(s): PROCALCITONIN,  WBC,  LACTICIDVEN Microbiology Recent Results (from the past 240 hour(s))  Resp Panel by RT-PCR (Flu A&B, Covid) Nasopharyngeal Swab     Status: None  Collection Time: 05/15/21 10:00 PM   Specimen: Nasopharyngeal Swab; Nasopharyngeal(NP) swabs in vial transport medium  Result Value Ref Range Status   SARS Coronavirus 2 by RT PCR NEGATIVE NEGATIVE Final    Comment: (NOTE) SARS-CoV-2 target nucleic acids are NOT DETECTED.  The SARS-CoV-2 RNA is generally detectable in upper respiratory specimens during the acute phase of infection. The lowest concentration of SARS-CoV-2 viral copies this assay can detect is 138 copies/mL. A negative result does not preclude SARS-Cov-2 infection and should not be used as the sole basis for treatment or other patient management decisions. A negative result may occur with  improper specimen collection/handling, submission of specimen other than nasopharyngeal swab, presence of viral mutation(s) within the areas targeted by this assay, and inadequate number of viral copies(<138 copies/mL). A negative result must be combined with clinical observations, patient history, and epidemiological information. The expected result is Negative.  Fact Sheet for Patients:  BloggerCourse.com  Fact Sheet for Healthcare Providers:   SeriousBroker.it  This test is no t yet approved or cleared by the [place] FDA and  has been authorized for detection and/or diagnosis of SARS-CoV-2 by FDA under an Emergency Use Authorization (EUA). This EUA will remain  in effect (meaning this test can be used) for the duration of the COVID-19 declaration under Section 564(b)(1) of the Act, 21 U.S.C.section 360bbb-3(b)(1), unless the authorization is terminated  or revoked sooner.       Influenza A by PCR NEGATIVE NEGATIVE Final   Influenza B by PCR NEGATIVE NEGATIVE Final    Comment: (NOTE) The Xpert Xpress SARS-CoV-2/FLU/RSV plus assay is intended as an aid in the diagnosis of influenza from Nasopharyngeal swab specimens and should not be used as a sole basis for treatment. Nasal washings and aspirates are unacceptable for Xpert Xpress SARS-CoV-2/FLU/RSV testing.  Fact Sheet for Patients: BloggerCourse.com  Fact Sheet for Healthcare Providers: SeriousBroker.it  This test is not yet approved or cleared by the [place] FDA and has been authorized for detection and/or diagnosis of SARS-CoV-2 by FDA under an Emergency Use Authorization (EUA). This EUA will remain in effect (meaning this test can be used) for the duration of the COVID-19 declaration under Section 564(b)(1) of the Act, 21 U.S.C. section 360bbb-3(b)(1), unless the authorization is terminated or revoked.  Performed at [facility], [address] [place], [place] [address]      Time coordinating discharge: 25 minutes         SIGNED:   Alberteen Sam, MD  [facility] 05/16/2021, 4:21 PM

## 2021-05-16 NOTE — ED Notes (Signed)
Pt calm, cooperative; denies SI, auditory and visual hallucinations at this time; sitter at bedside; will continue to monitor

## 2021-05-16 NOTE — ED Notes (Signed)
Spoke to [facility] at [phone] requesting guidance on IVCing pt per Dr Maryfrances Bunnell. Per [facility] EDP would be fastest way to get patient [facility]

## 2021-05-16 NOTE — Progress Notes (Signed)
Received report from ED. Room is ready.

## 2021-05-16 NOTE — ED Notes (Signed)
Dr. Delford Field off going EDP informed pt's wishes of eloping. Pt requiring medical admission but also recommended in patient treatment for SI. Pt has had auditory and visual hallucinations earlier in the night. Pt is now non-complaint. Concerns for continuation of care discussed with EDP. MD to IVC patient. Sitter at bedside.   Care Handoff given to [facility] RN

## 2021-05-20 LAB — PATHOLOGIST SMEAR REVIEW: Path Review: NORMAL

## 2021-05-21 LAB — CULTURE, BLOOD (ROUTINE X 2)
Culture: NO GROWTH
Culture: NO GROWTH
Special Requests: ADEQUATE
Special Requests: ADEQUATE

## 2021-10-17 ENCOUNTER — Other Ambulatory Visit: Payer: Self-pay

## 2021-10-17 ENCOUNTER — Emergency Department (HOSPITAL_BASED_OUTPATIENT_CLINIC_OR_DEPARTMENT_OTHER): Payer: Medicaid Other

## 2021-10-17 ENCOUNTER — Encounter (HOSPITAL_BASED_OUTPATIENT_CLINIC_OR_DEPARTMENT_OTHER): Payer: Self-pay | Admitting: Emergency Medicine

## 2021-10-17 ENCOUNTER — Emergency Department (HOSPITAL_BASED_OUTPATIENT_CLINIC_OR_DEPARTMENT_OTHER)
Admission: EM | Admit: 2021-10-17 | Discharge: 2021-10-17 | Disposition: A | Discharge status: Home / Self Care | Payer: Medicaid Other | Attending: Emergency Medicine | Admitting: Emergency Medicine

## 2021-10-17 DIAGNOSIS — M79604 Pain in right leg: Secondary | ICD-10-CM | POA: Diagnosis not present

## 2021-10-17 DIAGNOSIS — M79671 Pain in right foot: Secondary | ICD-10-CM | POA: Diagnosis not present

## 2021-10-17 DIAGNOSIS — M79672 Pain in left foot: Secondary | ICD-10-CM | POA: Diagnosis not present

## 2021-10-17 DIAGNOSIS — M79605 Pain in left leg: Secondary | ICD-10-CM | POA: Diagnosis not present

## 2021-10-17 LAB — BASIC METABOLIC PANEL
Anion gap: 5 (ref 5–15)
BUN: 8 mg/dL (ref 6–20)
CO2: 27 mmol/L (ref 22–32)
Calcium: 8.9 mg/dL (ref 8.9–10.3)
Chloride: 102 mmol/L (ref 98–111)
Creatinine, Ser: 0.69 mg/dL (ref 0.61–1.24)
GFR, Estimated: >60 mL/min (ref >60)
Glucose, Bld: 94 mg/dL (ref 70–99)
Potassium: 3.7 mmol/L (ref 3.5–5.1)
Sodium: 134 mmol/L — ABNORMAL LOW (ref 135–145)

## 2021-10-17 MED ORDER — KETOROLAC TROMETHAMINE 60 MG/2ML IM SOLN
30.0000 mg | Freq: Once | INTRAMUSCULAR | Status: AC
  Administered 2021-10-17: 30 mg via INTRAMUSCULAR
  Filled 2021-10-17: qty 2

## 2021-10-17 NOTE — ED Notes (Signed)
Pt. Reports he is having pain in his feet on the tops and his leg on the top.

## 2021-10-17 NOTE — ED Provider Notes (Signed)
[facility] DEPARTMENT Provider Note   CSN: [id number] Arrival date & time: 10/17/21  1552     History Chief Complaint  Patient presents with   Foot Pain    Alexander Gilmore is a 21 y.o. male with history of AKI presents the emergency department for evaluation of bilateral feet pain and swelling, left greater than right for the past 3 days.  He reports he is also has had bilateral hand swelling the last between 20 to 30 minutes and then goes away.  He reports he has googled his symptoms and is afraid there something wrong with his kidneys again.  He denies any dark urine or discoloration to his urine.  He denies any abdominal pain, nausea, vomiting.  Denies any fevers or recent URI symptoms.  He denies any history of DVT, PE, exogenous hormone use, or long travel.  The patient reports he works at [employer] and is on his feet for 8 to 10 hours a day.   Foot Pain Pertinent negatives include no chest pain, no abdominal pain and no shortness of breath.      Home Medications Prior to Admission medications   Medication Sig Start Date End Date Taking? Authorizing Provider  cetirizine (ZYRTEC) 10 MG tablet Take 10 mg by mouth daily as needed for allergies.    [provider]  fluticasone (FLONASE) 50 MCG/ACT nasal spray Place 1 spray into both nostrils daily as needed for allergies. 04/08/21   [provider]  ibuprofen (ADVIL) 600 MG tablet Take 600 mg by mouth every 6 (six) hours as needed for headache or moderate pain. 04/08/21   [provider]  Multiple Vitamins-Minerals (MULTI-VITAMIN GUMMIES PO) Take 2 tablets by mouth daily.    [provider]  ondansetron (ZOFRAN-ODT) 4 MG disintegrating tablet Take 4 mg by mouth 3 (three) times daily as needed for nausea. 01/26/21   [provider]  VRAYLAR 1.5 MG capsule Take 1.5 mg by mouth daily. 02/17/21   [provider]      Allergies    Patient has no known allergies.     Review of Systems   Review of Systems  Constitutional:  Negative for chills and fever.  Respiratory:  Negative for cough and shortness of breath.   Cardiovascular:  Positive for leg swelling. Negative for chest pain.  Gastrointestinal:  Negative for abdominal pain, constipation, diarrhea, nausea and vomiting.  Genitourinary:  Negative for dysuria and hematuria.  Musculoskeletal:  Positive for myalgias.  Neurological:  Negative for weakness and numbness.    See HPI Physical Exam Updated Vital Signs BP (!) 145/96 (BP Location: Right Arm)    Pulse 70    Temp 98 F (36.7 C) (Oral)    Resp 18    Ht 5\' 1"  (1.549 m)    Wt 72.6 kg    SpO2 99%    BMI 30.23 kg/m  Physical Exam Vitals and nursing note reviewed.  Constitutional:      General: He is not in acute distress.    Appearance: Normal appearance. He is not ill-appearing or toxic-appearing.  HENT:     Head: Normocephalic and atraumatic.  Eyes:     General: No scleral icterus.    Comments: No periorbital swelling.  Cardiovascular:     Rate and Rhythm: Normal rate and regular rhythm.  Pulmonary:     Effort: Pulmonary effort is normal. No respiratory distress.     Breath sounds: Normal breath sounds.  Abdominal:  General: Bowel sounds are normal.     Palpations: Abdomen is soft.     Tenderness: There is no abdominal tenderness. There is no guarding or rebound.  Musculoskeletal:        General: Tenderness present. No swelling or deformity. Normal range of motion.     Cervical back: Normal range of motion.     Right lower leg: No edema.     Left lower leg: No edema.     Comments: I do not appreciate any swelling or discoloration to the patient's bilateral lower legs and feet.  There are no wounds, abrasions, lacerations, erythremia, ecchymosis noted.  He has palpable DP and PT pulses bilaterally.  Sensations intact throughout.  He has full flexion extension and rotation of his ankle and is able to wiggle his toes although he has  some pain while doing this.  His compartments are soft.  No calf tenderness.  He is ambulatory without assistance. No increased warmth.  Cap refill less than 2 seconds.  Diffuse tenderness to the soles of bilateral feet.  For his bilateral hands, no swelling, erythema, ecchymosis, abrasions, lacerations, wounds, or any overlying skin changes are present.  He has full range of motion of his hands and wrist.  I do not notice any joint swelling as well.  No snuffbox tenderness bilaterally.  No signs of trauma or injury.  No increased warmth.  Compartments are soft.  Radial pulses present.  Cap refill less than 2 seconds.  Skin:    General: Skin is warm and dry.  Neurological:     General: No focal deficit present.     Mental Status: He is alert. Mental status is at baseline.     Motor: No weakness.     Gait: Gait normal.    ED Results / Procedures / Treatments   Labs (all labs ordered are listed, but only abnormal results are displayed) Labs Reviewed  BASIC METABOLIC PANEL - Abnormal; Notable for the following components:      Result Value   Sodium 134 (*)    All other components within normal limits  VITAMIN B12    EKG None  Radiology US Venous Img Lower Unilateral Left  Result Date: 10/17/2021 CLINICAL DATA:  Pain and swelling in the left leg for several days, initial encounter EXAM: LEFT LOWER EXTREMITY VENOUS DOPPLER ULTRASOUND TECHNIQUE: Gray-scale sonography with graded compression, as well as color Doppler and duplex ultrasound were performed to evaluate the lower extremity deep venous systems from the level of the common femoral vein and including the common femoral, femoral, profunda femoral, popliteal and calf veins including the posterior tibial, peroneal and gastrocnemius veins when visible. The superficial great saphenous vein was also interrogated. Spectral Doppler was utilized to evaluate flow at rest and with distal augmentation maneuvers in the common femoral, femoral and  popliteal veins. COMPARISON:  None. FINDINGS: Contralateral Common Femoral Vein: Respiratory phasicity is normal and symmetric with the symptomatic side. No evidence of thrombus. Normal compressibility. Common Femoral Vein: No evidence of thrombus. Normal compressibility, respiratory phasicity and response to augmentation. Saphenofemoral Junction: No evidence of thrombus. Normal compressibility and flow on color Doppler imaging. Profunda Femoral Vein: No evidence of thrombus. Normal compressibility and flow on color Doppler imaging. Femoral Vein: No evidence of thrombus. Normal compressibility, respiratory phasicity and response to augmentation. Popliteal Vein: No evidence of thrombus. Normal compressibility, respiratory phasicity and response to augmentation. Calf Veins: No evidence of thrombus. Normal compressibility and flow on color Doppler imaging. Superficial Great  Saphenous Vein: No evidence of thrombus. Normal compressibility. Venous Reflux:  None. Other Findings:  None. IMPRESSION: No evidence of deep venous thrombosis. Electronically Signed   By: Inez Catalina M.D.   On: 10/17/2021 19:59     Procedures Procedures   Medications Ordered in ED Medications - No data to display  ED Course/ Medical Decision Making/ A&P                           Medical Decision Making Amount and/or Complexity of Data Reviewed Labs: ordered. ECG/medicine tests: ordered.  Risk Prescription drug management.   21 year old male presents emerged part for evaluation of bilateral foot and lower leg pain left greater than right for the past 3 days as well as bilateral hand swelling episode lasting 20 to 30 minutes.  Differential diagnosis includes was not limited to DVT, cellulitis, nephrotic syndrome, arthritis, fracture, dislocation, septic arthritis, gout.  Vital signs show mild hypertension otherwise afebrile, normal pulse rate, satting well on room air.  Physical exam is unremarkable other than some nonfocal  tenderness to the bilateral soles of the feet.  Will order BMP and B12 as well as a DVT study of the left leg as that is bothering him the most.  I considered x-ray although the patient does not have any trauma or injury to the area there is also no focal point tenderness I do not think this is warranted.  I independently reviewed and interpreted the patient's labs and imaging and agree with the radiologist interpretation.  Ultrasound of the left lower extremity shows no signs of DVT.  MP shows slightly decreased sodium at 134 otherwise no other electrolyte abnormalities.  The patient's creatinine is 0.69 and his GFR is over 60.  He has a normal B12 level.  At this time, low suspicion for any DVT due to the negative ultrasound and reassuring physical exam.  Listed for cellulitis as there is no increased warmth or erythema to the lower legs.  No signs of cellulitis.  Doubt nephrotic syndrome due to the underwhelming physical exam as well as reassuring creatinine and GFR on BMP.  Assess previously have a low station for any fracture or dislocation due to the lack of trauma to the area.  Doubt gout as there is no overlying erythema or warmness and no point tenderness in any joints.  I doubt any septic arthritis as there is no increased warmth or erythema to any of the joints in his tenderness is more towards the soles of his bilateral feet.   The patient received an IM shot of Toradol while in the emergency department.   I think that this is likely foot pain from standing on his feet for extended amounts of time at work.  I recommended that he try compression socks to aid with his leg fatigue and pain.  Strict return precautions were discussed with him.  The patient agrees with plan.  Patient is stable being discharged home in good condition.   Final Clinical Impression(s) / ED Diagnoses Final diagnoses:  Bilateral leg pain    Rx / DC Orders ED Discharge Orders     None         Sherrell Puller,  PA-C 10/20/21 1421    Wyvonnia Dusky, MD 10/21/21 [phone]

## 2021-10-17 NOTE — ED Notes (Signed)
Pt to US.

## 2021-10-17 NOTE — ED Triage Notes (Signed)
Reports bil foot swelling and pain as well as hand swelling for the last few days.  Taking tylenol and aleve with no relief.  Do not appear swollen in triage.

## 2021-10-17 NOTE — Discharge Instructions (Addendum)
You were seen here today for evaluation of your bilateral leg swelling with pain in your bilateral hand swelling episodically.  Your labs were normal.  Please follow-up with your MyChart for results of your B12 level.  There is no DVT seen on your ultrasound.  I likely think this pain is due from sitting on her feet all day at work.  Please try compression socks at work to help with the circulation and fatigue of your legs.  Follow-up with your primary care provider for further evaluation.  If you have any worsening swelling, pain, color changes, numbness, tingling, or coldness to the leg, please return to the nearest emergency department for reevaluation.

## 2021-10-18 LAB — VITAMIN B12: Vitamin B-12: 356 pg/mL (ref 180–914)

## 2021-10-31 ENCOUNTER — Encounter (HOSPITAL_COMMUNITY): Payer: Self-pay | Admitting: Emergency Medicine

## 2021-10-31 ENCOUNTER — Other Ambulatory Visit: Payer: Self-pay

## 2021-10-31 ENCOUNTER — Emergency Department (HOSPITAL_COMMUNITY)
Admission: EM | Admit: 2021-10-31 | Discharge: 2021-11-01 | Disposition: A | Discharge status: Home / Self Care | Payer: Medicaid Other | Attending: Student | Admitting: Student

## 2021-10-31 DIAGNOSIS — K649 Unspecified hemorrhoids: Secondary | ICD-10-CM | POA: Diagnosis not present

## 2021-10-31 DIAGNOSIS — K6289 Other specified diseases of anus and rectum: Secondary | ICD-10-CM | POA: Diagnosis present

## 2021-10-31 NOTE — ED Triage Notes (Signed)
The pt arrived by gems from home c/o painful hemorrhoids for 3 months   to waiting room until we have room ?

## 2021-10-31 NOTE — ED Provider Triage Note (Signed)
Emergency Medicine Provider Triage Evaluation Note ? ?Alexander Gilmore , a 21 y.o. male  was evaluated in triage.  Pt complains of painful hemorrhoids over the last few months.  Denies active bleed or recent injury to the area.  Does not recall noticing any blood when he wipes.  States he feels a "lump" right near the rectal orifice that is painful to the touch.  Denies ever being evaluated for this before.  Has tried Preparation H with no relief. ? ?Review of Systems  ?Positive: As above ?Negative: As above ? ?Physical Exam  ?BP 137/78   Pulse 81   Temp 98.9 ?F (37.2 ?C) (Oral)   Resp 16   Ht 5\' 1"  (1.549 m)   Wt 82 kg   SpO2 96%   BMI 34.16 kg/m?  ?Gen:   Awake, no distress   ?Resp:  Normal effort  ?MSK:   Moves extremities without difficulty  ?Other:  Chaperone in room.  Visualized lump of skin near the rectal orifice that was TTP ? ?Medical Decision Making  ?Medically screening exam initiated at 9:41 PM.  Appropriate orders placed.  Alexander Gilmore was informed that the remainder of the evaluation will be completed by another provider, this initial triage assessment does not replace that evaluation, and the importance of remaining in the ED until their evaluation is complete. ?  ?Alexander Reef, PA-C ?10/31/21 2149 ? ?

## 2021-10-31 NOTE — ED Triage Notes (Signed)
Patient reports intermittent rectal pain for several months / hemorrhoids for several months , denies bleeding or injury.  ?

## 2021-11-01 MED ORDER — HYDROCORTISONE (PERIANAL) 2.5 % EX CREA: 1.0000 "application " | TOPICAL_CREAM | Freq: Two times a day (BID) | CUTANEOUS | 0 refills | Status: AC

## 2021-11-01 MED ORDER — POLYETHYLENE GLYCOL 3350 17 G PO PACK: 17.0000 g | PACK | Freq: Every day | ORAL | 0 refills | Status: AC

## 2021-11-01 NOTE — ED Provider Notes (Signed)
?[facility] EMERGENCY DEPARTMENT ?Provider Note ? ?CSN: [id number] ?Arrival date & time: 10/31/21 1856 ? ?Chief Complaint(s) ?Hemorrhoids ? ?HPI ?Alexander Gilmore is a 21 y.o. male who presents emergency department for evaluation of rectal pain.  Patient states that he feels a mass on the rectum and is concerned for hemorrhoid.  He states that he has a history of constipation and straining frequently.  Denies blood in stool.  Does endorse anal receptive sex.  Denies chest pain, shortness of breath, abdominal pain, nausea, vomiting or other systemic symptoms. ? ?HPI ? ?Past Medical History ?History reviewed. No pertinent past medical history. ?Patient Active Problem List  ? Diagnosis Date Noted  ? Acute kidney injury (HCC) 05/15/2021  ? Leukocytosis 05/15/2021  ? Suicidal ideation 05/15/2021  ? ?Home Medication(s) ?Prior to Admission medications   ?Medication Sig Start Date End Date Taking? Authorizing Provider  ?cetirizine (ZYRTEC) 10 MG tablet Take 10 mg by mouth daily as needed for allergies.    [provider]  ?fluticasone (FLONASE) 50 MCG/ACT nasal spray Place 1 spray into both nostrils daily as needed for allergies. 04/08/21   [provider]  ?ibuprofen (ADVIL) 600 MG tablet Take 600 mg by mouth every 6 (six) hours as needed for headache or moderate pain. 04/08/21   [provider]  ?Multiple Vitamins-Minerals (MULTI-VITAMIN GUMMIES PO) Take 2 tablets by mouth daily.    [provider]  ?ondansetron (ZOFRAN-ODT) 4 MG disintegrating tablet Take 4 mg by mouth 3 (three) times daily as needed for nausea. 01/26/21   [provider]  ?VRAYLAR 1.5 MG capsule Take 1.5 mg by mouth daily. 02/17/21   [provider]  ?                                                                                                                                  ?Past Surgical History ?History reviewed. No pertinent surgical history. ?Family History ?No family history on  file. ? ?Social History ?Social History  ? ?Tobacco Use  ? Smoking status: Never  ? Smokeless tobacco: Never  ?Substance Use Topics  ? Alcohol use: Never  ? Drug use: Never  ? ?Allergies ?Patient has no known allergies. ? ?Review of Systems ?Review of Systems  ?Gastrointestinal:  Positive for rectal pain.  ? ?Physical Exam ?Vital Signs  ?I have reviewed the triage vital signs ?BP 140/81   Pulse 77   Temp 98.9 ?F (37.2 ?C) (Oral)   Resp 16   Ht 5\' 1"  (1.549 m)   Wt 82 kg   SpO2 96%   BMI 34.16 kg/m?  ? ?Physical Exam ?Vitals and nursing note reviewed.  ?Constitutional:   ?   General: He is not in acute distress. ?   Appearance: He is well-developed.  ?HENT:  ?   Head: Normocephalic and atraumatic.  ?Eyes:  ?   Conjunctiva/sclera: Conjunctivae normal.  ?Cardiovascular:  ?   Rate and  Rhythm: Normal rate and regular rhythm.  ?   Heart sounds: No murmur heard. ?Pulmonary:  ?   Effort: Pulmonary effort is normal. No respiratory distress.  ?   Breath sounds: Normal breath sounds.  ?Abdominal:  ?   Palpations: Abdomen is soft.  ?   Tenderness: There is no abdominal tenderness.  ?Genitourinary: ?   Comments: Half centimeter hemorrhoid at the 4 o'clock position ?Musculoskeletal:     ?   General: No swelling.  ?   Cervical back: Neck supple.  ?Skin: ?   General: Skin is warm and dry.  ?   Capillary Refill: Capillary refill takes less than 2 seconds.  ?Neurological:  ?   Mental Status: He is alert.  ?Psychiatric:     ?   Mood and Affect: Mood normal.  ? ? ?ED Results and Treatments ?Labs ?(all labs ordered are listed, but only abnormal results are displayed) ?Labs Reviewed - No data to display                                                                                                                       ? ?Radiology ?No results found. ? ?Pertinent labs & imaging results that were available during my care of the patient were reviewed by me and considered in my medical decision making (see MDM for  details). ? ?Medications Ordered in ED ?Medications - No data to display                                                               ?                                                                    ?Procedures ?Procedures ? ?(including critical care time) ? ?Medical Decision Making / ED Course ? ? ?This patient presents to the ED for concern of rectal pain, this involves an extensive number of treatment options, and is a complaint that carries with it a high risk of complications and morbidity.  The differential diagnosis includes external hemorrhoid, rectal fissure, perianal abscess ? ?MDM: ?Patient seen the emergency department for evaluation of rectal pain.  Physical exam reveals a 0.5 cm nonthrombosed hemorrhoid at the 4 o'clock position.  No evidence of abscess.  Patient presentation consistent with external hemorrhoids and he will be discharged with a prescription for Anusol cream and MiraLAX as his bowel regimen.  He was given resources to establish primary care physician and was discharged. ? ? ?Additional history obtained: ? ?-External records from outside source  obtained and reviewed including: Chart review including previous notes, labs, imaging, consultation notes ? ? ?Lab Tests: ?-I ordered, reviewed, and interpreted labs.   ?The pertinent results include:   ?Labs Reviewed - No data to display  ? ? ? ? ?Medicines ordered and prescription drug management: ?No orders of the defined types were placed in this encounter. ?  ?-I have reviewed the patients home medicines and have made adjustments as needed ? ?Critical interventions ?none ? ?Cardiac Monitoring: ?The patient was maintained on a cardiac monitor.  I personally viewed and interpreted the cardiac monitored which showed an underlying rhythm of: NSr ? ?Social Determinants of Health:  ?Factors impacting patients care include: No primary care doc ? ? ?Reevaluation: ?After the interventions noted above, I reevaluated the patient and found that they  have :stayed the same ? ?Co morbidities that complicate the patient evaluation ?History reviewed. No pertinent past medical history.  ? ? ?Dispostion: ?I considered admission for this patient, but his diagnosis of a hemorrhoid can be addressed in the outpatient setting and does not meet inpatient criteria for admission ? ? ? ? ?Final Clinical Impression(s) / ED Diagnoses ?Final diagnoses:  ?None  ? ? ? ?@PCDICTATION @ ? ?  ? , MD ?11/01/21 [phone] ? ?

## 2021-11-11 ENCOUNTER — Other Ambulatory Visit: Payer: Self-pay

## 2021-11-11 ENCOUNTER — Encounter (HOSPITAL_BASED_OUTPATIENT_CLINIC_OR_DEPARTMENT_OTHER): Payer: Self-pay | Admitting: Urology

## 2021-11-11 ENCOUNTER — Emergency Department (HOSPITAL_BASED_OUTPATIENT_CLINIC_OR_DEPARTMENT_OTHER)
Admission: EM | Admit: 2021-11-11 | Discharge: 2021-11-11 | Disposition: A | Discharge status: Home / Self Care | Payer: Medicaid Other | Attending: Emergency Medicine | Admitting: Emergency Medicine

## 2021-11-11 DIAGNOSIS — L02412 Cutaneous abscess of left axilla: Secondary | ICD-10-CM | POA: Diagnosis present

## 2021-11-11 MED ORDER — OXYCODONE-ACETAMINOPHEN 5-325 MG PO TABS: 1.0000 | ORAL_TABLET | Freq: Three times a day (TID) | ORAL | 0 refills | Status: AC | PRN

## 2021-11-11 MED ORDER — OXYCODONE-ACETAMINOPHEN 5-325 MG PO TABS
1.0000 | ORAL_TABLET | Freq: Once | ORAL | Status: AC
  Administered 2021-11-11: 1 via ORAL
  Filled 2021-11-11: qty 1

## 2021-11-11 MED ORDER — DOXYCYCLINE HYCLATE 100 MG PO CAPS: 100.0000 mg | ORAL_CAPSULE | Freq: Two times a day (BID) | ORAL | 0 refills | Status: AC

## 2021-11-11 MED ORDER — LIDOCAINE-EPINEPHRINE (PF) 2 %-1:200000 IJ SOLN
10.0000 mL | Freq: Once | INTRAMUSCULAR | Status: AC
  Administered 2021-11-11: 10 mL
  Filled 2021-11-11: qty 20

## 2021-11-11 NOTE — ED Provider Notes (Signed)
?[facility] EMERGENCY DEPARTMENT ?Provider Note ? ? ?CSN: [id number] ?Arrival date & time: 11/11/21  2011 ? ?  ? ?History ? ?Chief Complaint  ?Patient presents with  ? Abscess  ? ? ?Alexander Gilmore is a 21 y.o. male. ? ?Patient with no pertinent past medical history presents today with complaints of abscess. He states that same has been present for the past week. Initially presented as a small pimple in his left axilla which he attempted to pop with minimal success, states that same has been increasing in size daily with significant associated pain and swelling. Denies any history of abscesses in the past, he is not a diabetic. States that the wound has not been draining. Also denies fevers or chills.  ? ?The history is provided by the patient. No language interpreter was used.  ?Abscess ?Associated symptoms: no fever   ? ?  ? ?Home Medications ?Prior to Admission medications   ?Medication Sig Start Date End Date Taking? Authorizing Provider  ?cetirizine (ZYRTEC) 10 MG tablet Take 10 mg by mouth daily as needed for allergies.    [provider]  ?fluticasone (FLONASE) 50 MCG/ACT nasal spray Place 1 spray into both nostrils daily as needed for allergies. 04/08/21   [provider]  ?hydrocortisone (ANUSOL-HC) 2.5 % rectal cream Place 1 application rectally 2 (two) times daily. 11/01/21   Kommor, Madison, MD  ?ibuprofen (ADVIL) 600 MG tablet Take 600 mg by mouth every 6 (six) hours as needed for headache or moderate pain. 04/08/21   [provider]  ?Multiple Vitamins-Minerals (MULTI-VITAMIN GUMMIES PO) Take 2 tablets by mouth daily.    [provider]  ?ondansetron (ZOFRAN-ODT) 4 MG disintegrating tablet Take 4 mg by mouth 3 (three) times daily as needed for nausea. 01/26/21   [provider]  ?polyethylene glycol (MIRALAX) 17 g packet Take 17 g by mouth daily. 11/01/21   Kommor, Madison, MD  ?VRAYLAR 1.5 MG capsule Take 1.5 mg by mouth daily. 02/17/21   [provider]  ?   ? ?Allergies    ?Patient has no known allergies.   ? ?Review of Systems   ?Review of Systems  ?Constitutional:  Negative for chills and fever.  ?Skin:  Positive for wound.  ?All other systems reviewed and are negative. ? ?Physical Exam ?Updated Vital Signs ?BP (!) 164/106 (BP Location: Right Arm)   Pulse 78   Temp 98.6 ?F (37 ?C) (Oral)   Resp 18   Ht 5\' 1"  (1.549 m)   Wt 82 kg   SpO2 100%   BMI 34.16 kg/m?  ?Physical Exam ?Vitals and nursing note reviewed.  ?Constitutional:   ?   General: He is not in acute distress. ?   Appearance: Normal appearance. He is obese. He is not ill-appearing, toxic-appearing or diaphoretic.  ?   Comments: Patient overall well appearing laying in bed in some discomfort  ?HENT:  ?   Head: Normocephalic and atraumatic.  ?Cardiovascular:  ?   Rate and Rhythm: Normal rate.  ?Pulmonary:  ?   Effort: Pulmonary effort is normal. No respiratory distress.  ?Musculoskeletal:     ?   General: Normal range of motion.  ?   Cervical back: Normal range of motion.  ?Skin: ?   General: Skin is warm and dry.  ?   Comments: Area of fluctuance with surrounding induration noted to the middle of the left axilla. Some associated erythema. No active drainage  ?Neurological:  ?   General: No  focal deficit present.  ?   Mental Status: He is alert.  ?Psychiatric:     ?   Mood and Affect: Mood normal.     ?   Behavior: Behavior normal.  ? ? ?ED Results / Procedures / Treatments   ?Labs ?(all labs ordered are listed, but only abnormal results are displayed) ?Labs Reviewed - No data to display ? ?EKG ?None ? ?Radiology ?No results found. ? ?Procedures ?Marland Kitchen.Incision and Drainage ? ?Date/Time: 11/11/2021 11:23 PM ?Performed by: Silva Bandy, PA-C ?Authorized by: Silva Bandy, PA-C  ? ?Consent:  ?  Consent obtained:  Verbal ?  Consent given by:  Patient ?  Risks, benefits, and alternatives were discussed: yes   ?  Risks discussed:  Bleeding, incomplete drainage, pain, damage to other organs  and infection ?  Alternatives discussed:  No treatment, delayed treatment, alternative treatment and observation ?Universal protocol:  ?  Procedure explained and questions answered to patient or proxy's satisfaction: yes   ?  Required blood products, implants, devices, and special equipment available: yes   ?  Patient identity confirmed:  Verbally with patient ?Location:  ?  Type:  Abscess ?  Size:  3 cm x 3 cm ?  Location:  Upper extremity ?  Upper extremity location: left axilla. ?Pre-procedure details:  ?  Skin preparation:  Povidone-iodine ?Sedation:  ?  Sedation type:  None ?Anesthesia:  ?  Anesthesia method:  Local infiltration ?  Local anesthetic:  Lidocaine 2% WITH epi ?Procedure type:  ?  Complexity:  Simple ?Procedure details:  ?  Incision types:  Single straight ?  Incision depth:  Subcutaneous ?  Wound management:  Probed and deloculated ?  Drainage:  Purulent and bloody ?  Drainage amount:  Moderate ?  Wound treatment:  Wound left open ?  Packing materials:  None ?Post-procedure details:  ?  Procedure completion:  Tolerated well, no immediate complications  ? ? ?Medications Ordered in ED ?Medications  ?oxyCODONE-acetaminophen (PERCOCET/ROXICET) 5-325 MG per tablet 1 tablet (1 tablet Oral Given 11/11/21 2111)  ?lidocaine-EPINEPHrine (XYLOCAINE W/EPI) 2 %-1:200000 (PF) injection 10 mL (10 mLs Infiltration Given 11/11/21 2111)  ? ? ?ED Course/ Medical Decision Making/ A&P ?  ?                        ?Medical Decision Making ?Risk ?Prescription drug management. ? ? ?Patient presents with developing skin abscess to the left axilla over the past week. US obtained which revealed a small open area close to the surface of the skin. Discussed with patient risks vs benefits of drainage vs warm compresses with oral antibiotics. Patient opted to move forward with drainage. Some purulent drainage expressed from the wound. Abscess was not large enough to warrant packing or drain, will recommend wound recheck in 2 days.  Encouraged home warm soaks and flushing.  Mild signs of cellulitis is surrounding skin. Given this, will discharge with antibiotics. Patient also in significant discomfort, will also give pain meds to go home with.  Patient understood standing and amenable with plan, educated on red flag symptoms of prompt immediate return.  Discharged stable condition. ? ?Findings and plan of care discussed with supervising physician Dr. Deretha Emory who is in agreement.  ? ?Final Clinical Impression(s) / ED Diagnoses ?Final diagnoses:  ?Abscess of left axilla  ? ? ?Rx / DC Orders ?ED Discharge Orders   ? ?      Ordered  ?  oxyCODONE-acetaminophen (PERCOCET/ROXICET) 5-325  MG tablet  Every 8 hours PRN       ? 11/11/21 2254  ?  doxycycline (VIBRAMYCIN) 100 MG capsule  2 times daily       ? 11/11/21 2254  ? ?  ?  ? ?  ?An After Visit Summary was printed and given to the patient. ? ? ?  ?Silva BandySmoot, Joclynn Lumb A, PA-C ?11/11/21 2326 ? ?  ?Vanetta MuldersZackowski, Scott, MD ?11/14/21 1611 ? ?

## 2021-11-11 NOTE — ED Triage Notes (Signed)
Abscess to left axilla that was noticed 10 days ago ?No drainage at this time  ? ?

## 2021-11-11 NOTE — Discharge Instructions (Addendum)
As we discussed, your abscess was drained in the ER this evening. I have also given you a prescription for an antibiotic for you to take to help prevent infection. Please take this as prescribed in its entirety. I have also given you a prescription for narcotic pain medication. Please take this as prescribed as needed only for severe pain and do not drive operate heavy machinery after taking this medication. ? ?Additionally, the most important thing is that you ensure that this wound stays open and heals from the inside out.  The best way to do this is to have warm water from the shower spray directly on the wound.  It would likely continue to drain over the next few days, this is normal.  I also recommend that you place warm compresses on the wound.  You will also need someone to look at this wound in the next few days to ensure that it is properly healing.  That can be done at a primary care office or urgent care. ? ?Return if development of any new or worsening symptoms. ?

## 2022-12-02 IMAGING — US US EXTREM LOW VENOUS*L*
1 series · 13 of 24 positions shown · non-contrast
Comparison: None.

CLINICAL DATA: Pain and swelling in the left leg for several days,
initial encounter



[Series 1: us extrem low venous*left* · 13 of 42 slices shown]
[im 1/42]
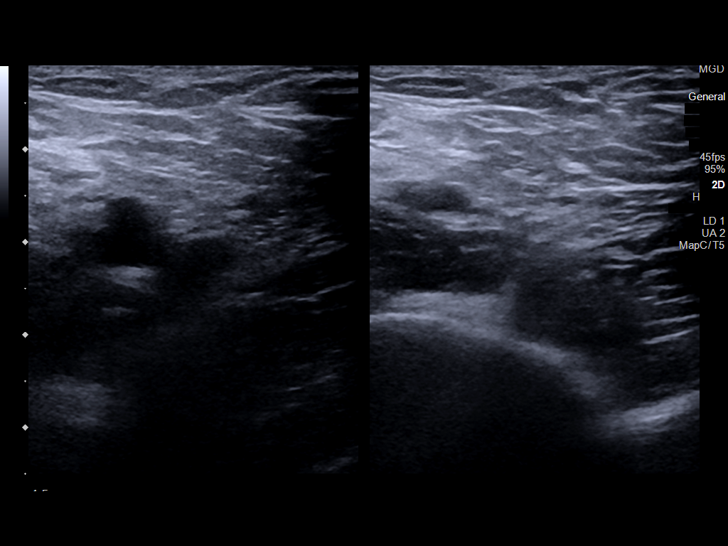
[im 4/42]
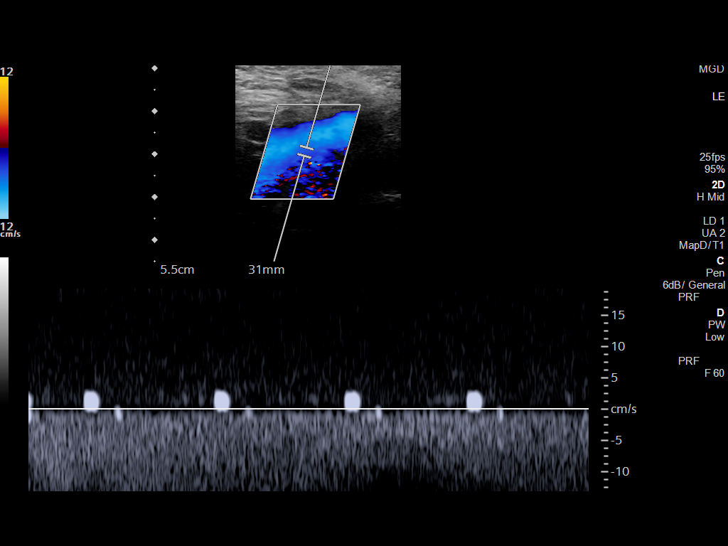
[im 8/42]
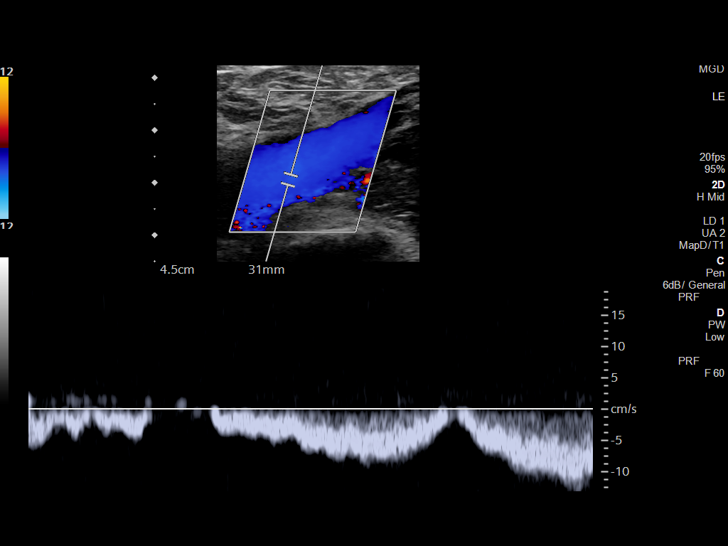
[im 11/42]
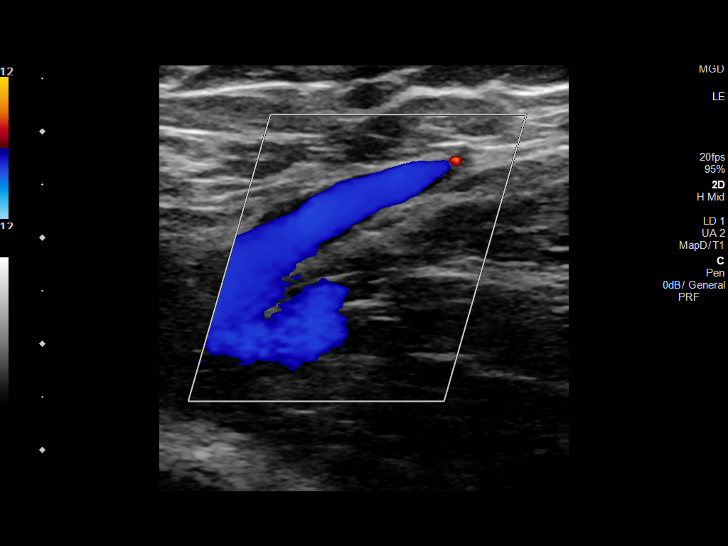
[im 15/42]
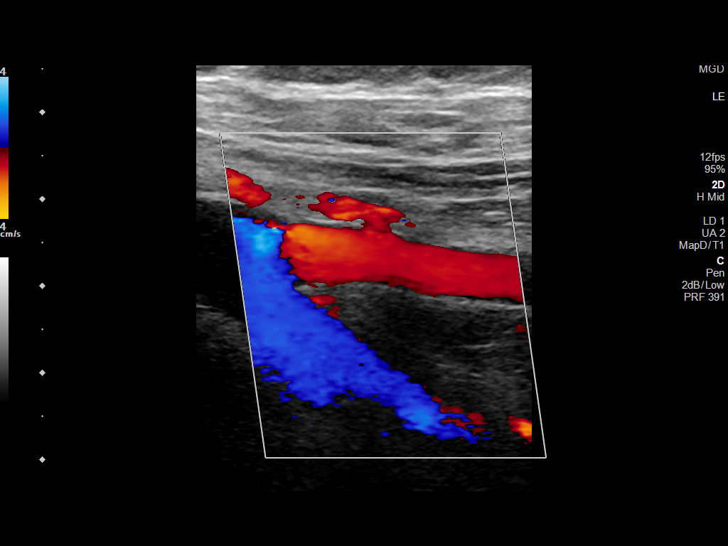
[im 18/42]
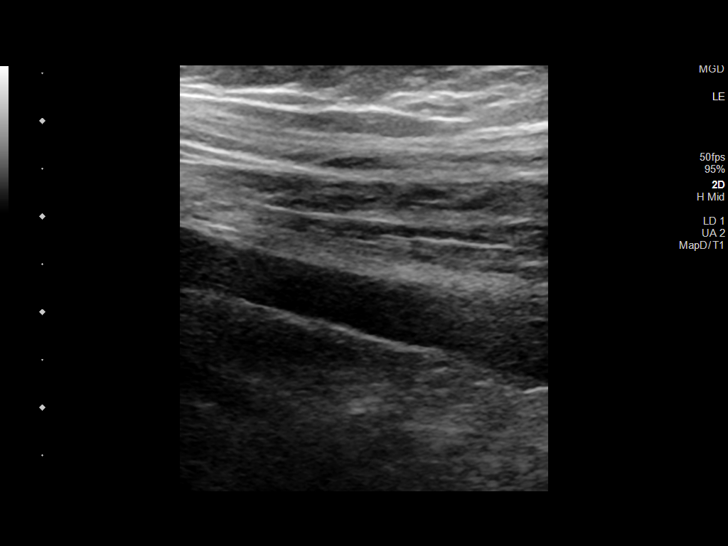
[im 22/42]
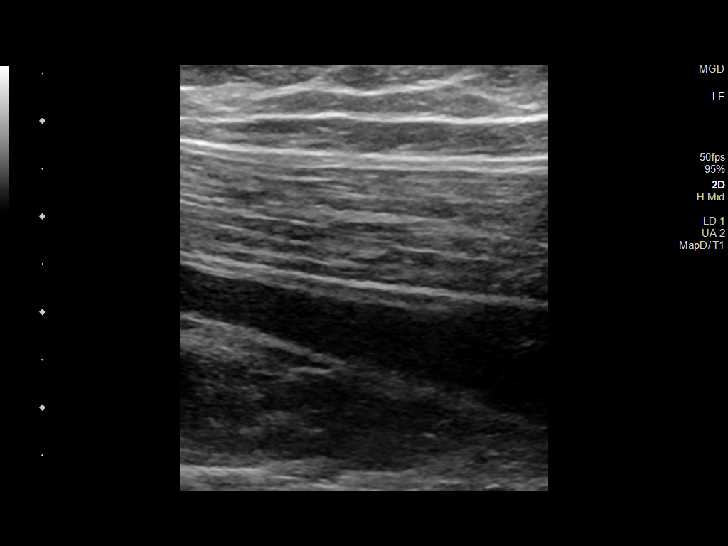
[im 24/42]
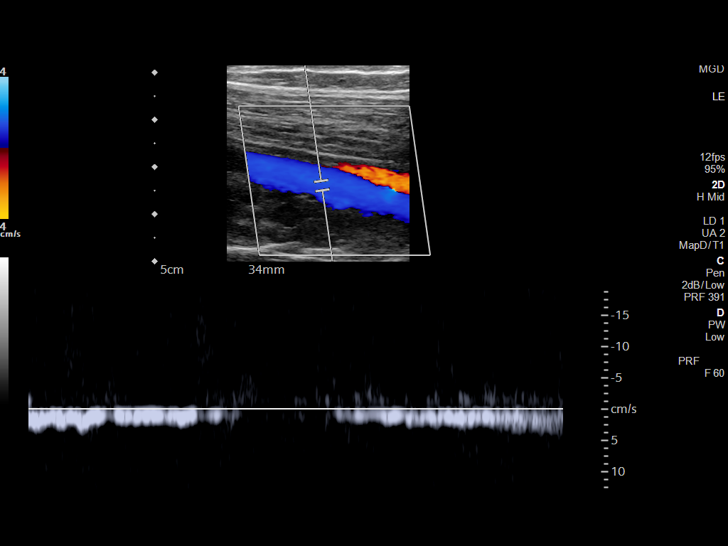
[im 27/42]
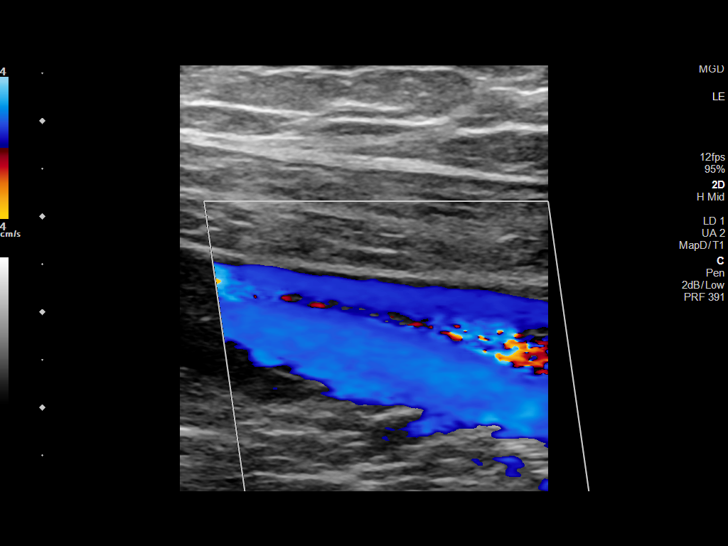
[im 31/42]
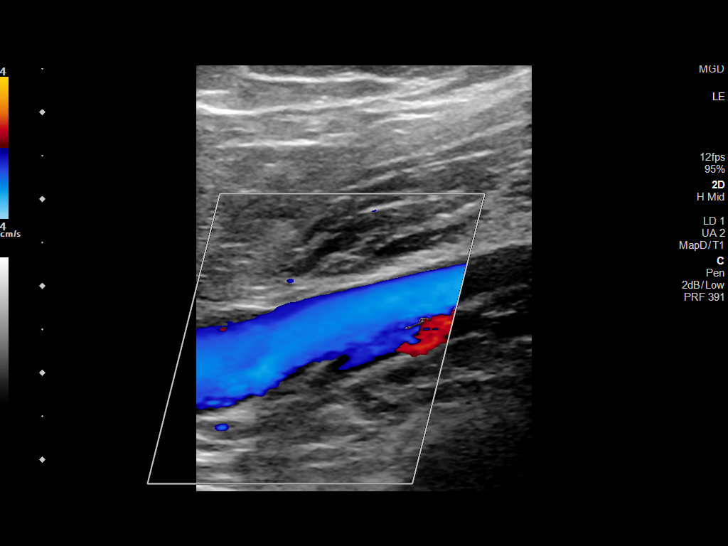
[im 34/42]
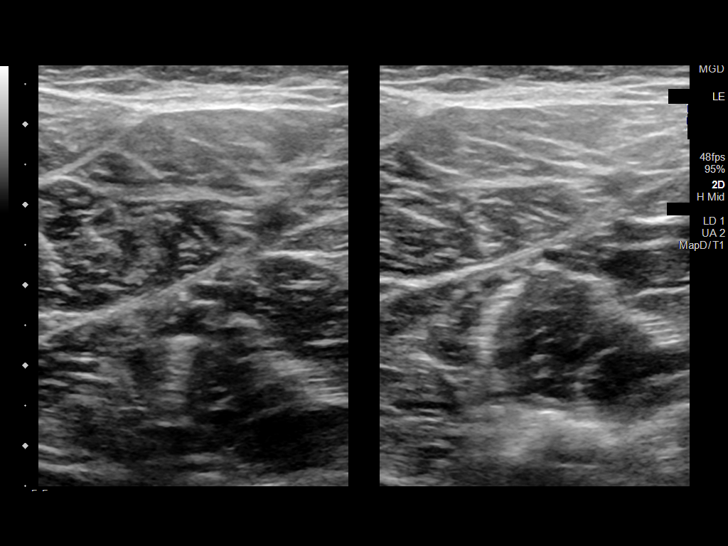
[im 38/42]
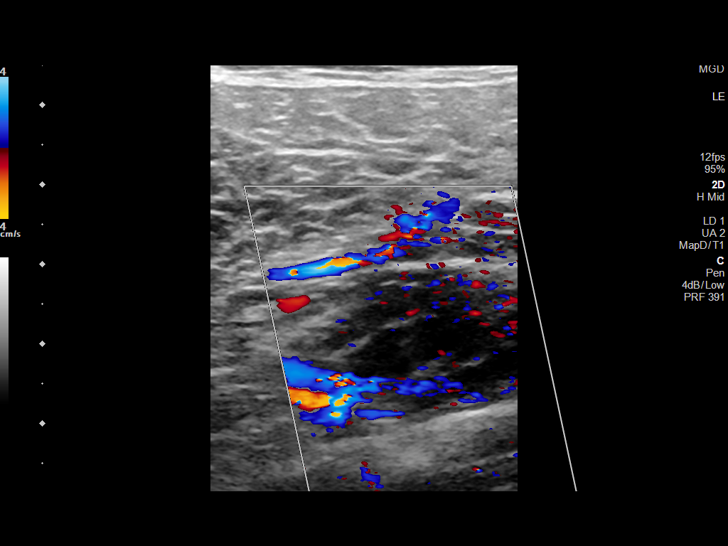
[im 42/42]
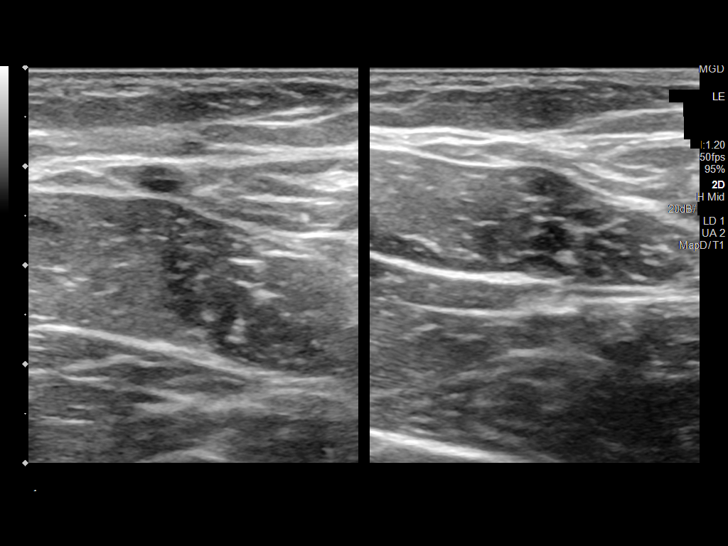

[13 of 24 positions shown; findings below may reference images not displayed]

FINDINGS: Contralateral Common Femoral Vein: Respiratory phasicity is normal
and symmetric with the symptomatic side. No evidence of thrombus.
Normal compressibility.

Common Femoral Vein: No evidence of thrombus. Normal
compressibility, respiratory phasicity and response to augmentation.

Saphenofemoral Junction: No evidence of thrombus. Normal
compressibility and flow on color Doppler imaging.

Profunda Femoral Vein: No evidence of thrombus. Normal
compressibility and flow on color Doppler imaging.

Femoral Vein: No evidence of thrombus. Normal compressibility,
respiratory phasicity and response to augmentation.

Popliteal Vein: No evidence of thrombus. Normal compressibility,
respiratory phasicity and response to augmentation.

Calf Veins: No evidence of thrombus. Normal compressibility and flow
on color Doppler imaging.

Superficial Great Saphenous Vein: No evidence of thrombus. Normal
compressibility.

Venous Reflux:  None.

Other Findings:  None.
IMPRESSION: No evidence of deep venous thrombosis.
